# Patient Record
Sex: Male | Born: 1965 | Race: Black or African American | Hispanic: No | Marital: Married | Smoking: Current every day smoker
Health system: Southern US, Community
[De-identification: ages and names within clinical notes are randomized; demographics above are authoritative.]

## PROBLEM LIST (undated history)

## (undated) DIAGNOSIS — Z992 Dependence on renal dialysis: Secondary | ICD-10-CM

## (undated) DIAGNOSIS — N189 Chronic kidney disease, unspecified: Secondary | ICD-10-CM

## (undated) DIAGNOSIS — I10 Essential (primary) hypertension: Secondary | ICD-10-CM

## (undated) HISTORY — PX: APPENDECTOMY: SHX54

## (undated) HISTORY — PX: AV FISTULA PLACEMENT: SHX1204

---

## 2011-09-11 ENCOUNTER — Emergency Department: Payer: Self-pay | Admitting: Emergency Medicine

## 2011-11-19 ENCOUNTER — Inpatient Hospital Stay: Payer: Self-pay | Admitting: Internal Medicine

## 2011-11-19 LAB — BASIC METABOLIC PANEL
Anion Gap: 11 (ref 7–16)
BUN: 21 mg/dL — ABNORMAL HIGH (ref 7–18)
EGFR (African American): 10 — ABNORMAL LOW
EGFR (Non-African Amer.): 8 — ABNORMAL LOW
Osmolality: 279 (ref 275–301)
Potassium: 4.6 mmol/L (ref 3.5–5.1)
Sodium: 139 mmol/L (ref 136–145)

## 2011-11-19 LAB — CBC
HGB: 10.4 g/dL — ABNORMAL LOW (ref 13.0–18.0)
MCV: 89 fL (ref 80–100)
Platelet: 156 10*3/uL (ref 150–440)
RBC: 3.67 10*6/uL — ABNORMAL LOW (ref 4.40–5.90)

## 2011-11-19 IMAGING — CR DG CHEST 2V
1 series · 2 of 2 positions shown · non-contrast
Comparison: none

REASON FOR EXAM: cough
COMMENTS:

[Series 1: pa · 0.17mm/px · 2 of 2 slices shown]
[im 1/2]
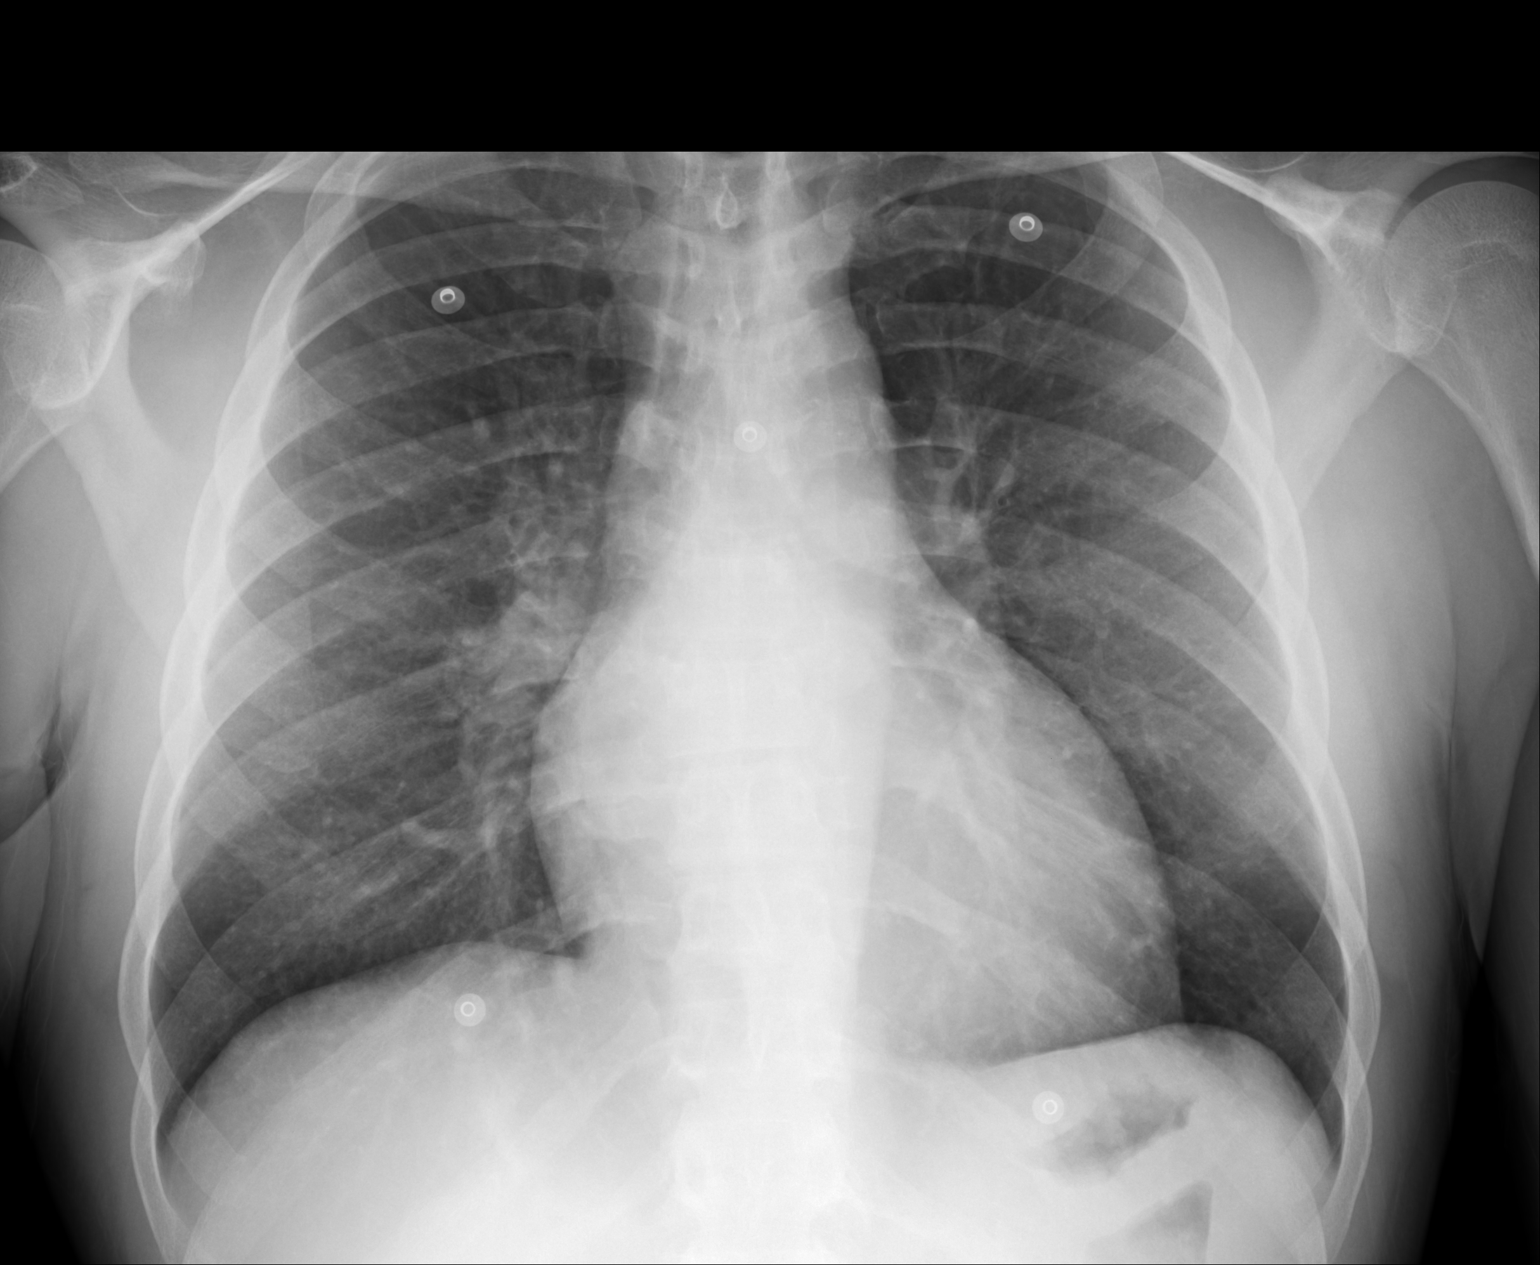
[im 2/2]
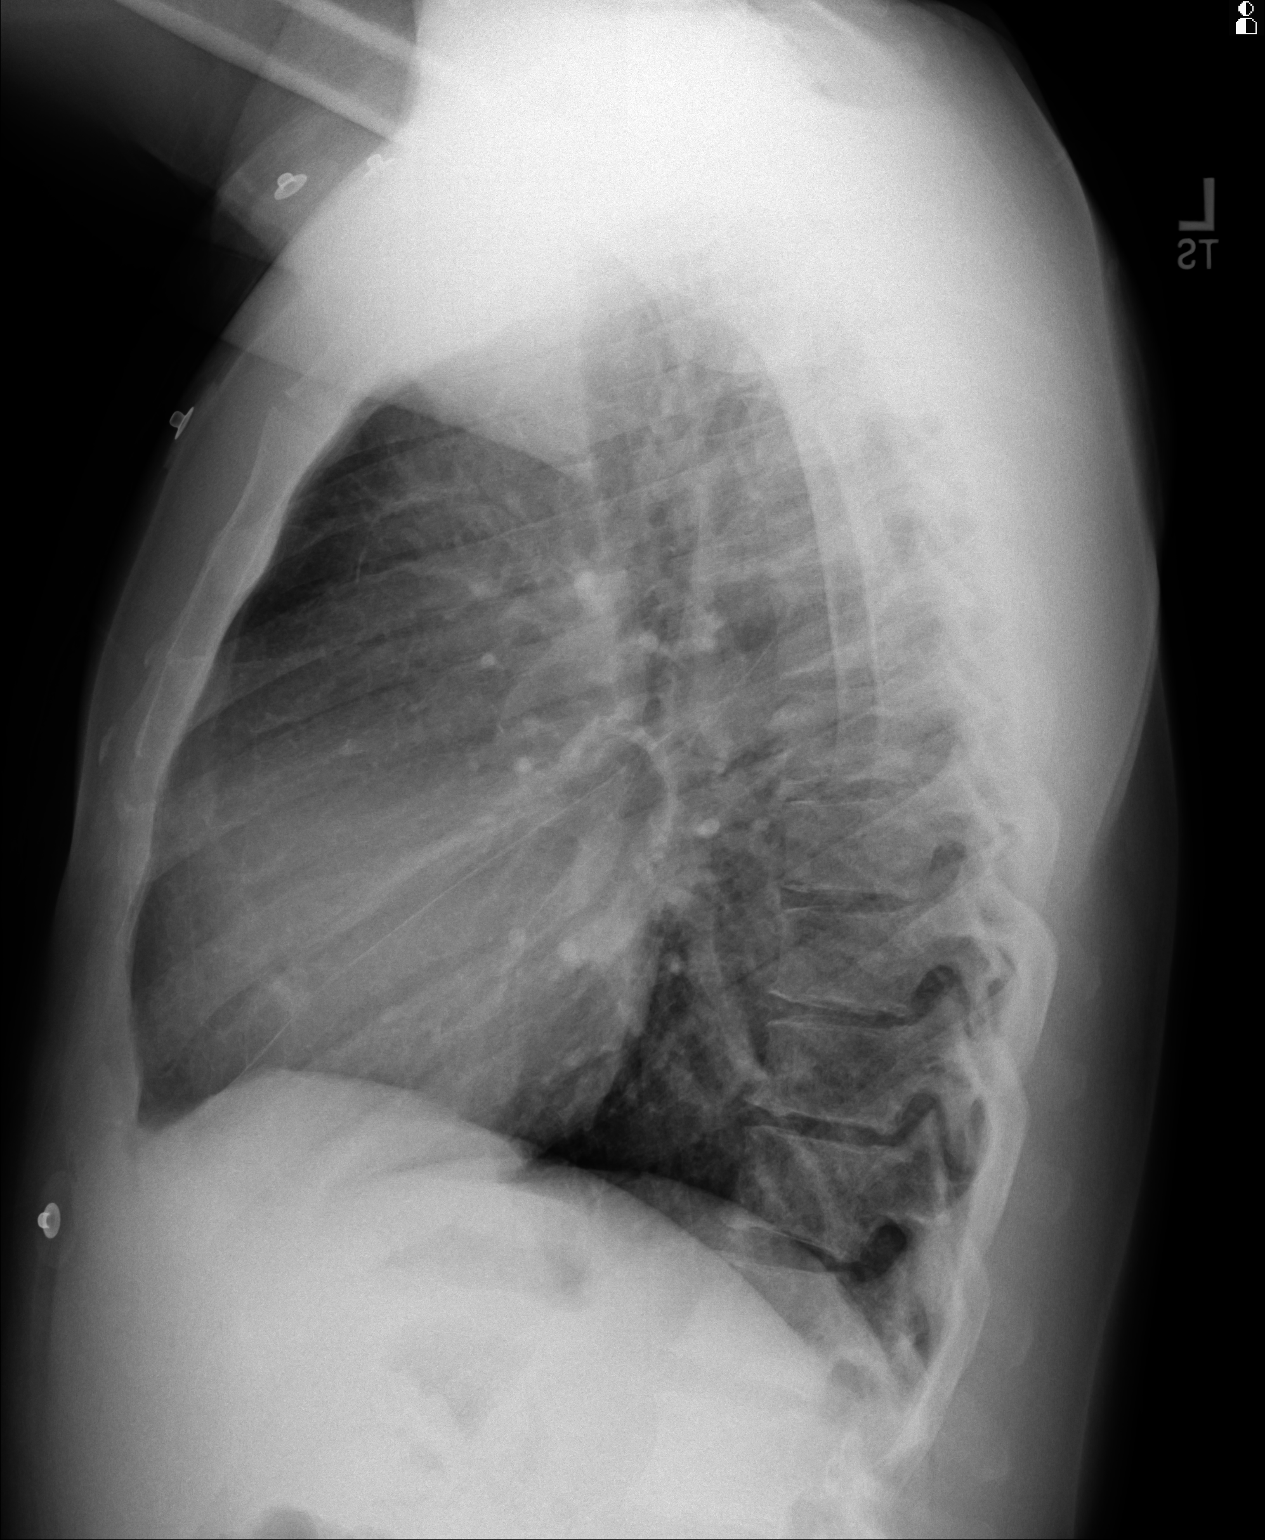

[2 of 2 positions shown; findings below may reference images not displayed]

PROCEDURE:     DXR - DXR CHEST PA (OR AP) AND LATERAL  - [DATE]  [DATE]

RESULT:     The lung fields are clear. No pneumonia, pneumothorax or pleural
effusion is seen. The heart appears mildly enlarged. No pulmonary edema or
pleural effusion is noted. The chest appears mildly hyperinflated
bilaterally which suggests a history of asthma or early manifestation of
COPD. No acute bony abnormalities are seen.
IMPRESSION: 1. No acute changes are identified.
2. The heart appears mildly enlarged.
3. The chest appears mildly hyperinflated bilaterally.

## 2011-11-20 LAB — TROPONIN I: Troponin-I: 0.07 ng/mL — ABNORMAL HIGH

## 2012-07-19 ENCOUNTER — Emergency Department: Payer: Self-pay | Admitting: Unknown Physician Specialty

## 2012-07-19 IMAGING — CR DG HAND COMPLETE 3+V*L*
1 series · 3 of 3 positions shown · non-contrast
Comparison: none

REASON FOR EXAM: fall
COMMENTS:   May transport without cardiac monitor

PROCEDURE:     DXR - DXR HAND LT COMPLETE  W/OBLIQUES  - [DATE]  [DATE]
RESULT:

[Series 1: x hand pa left · 0.14mm/px · 3 of 3 slices shown]
[im 1/3]
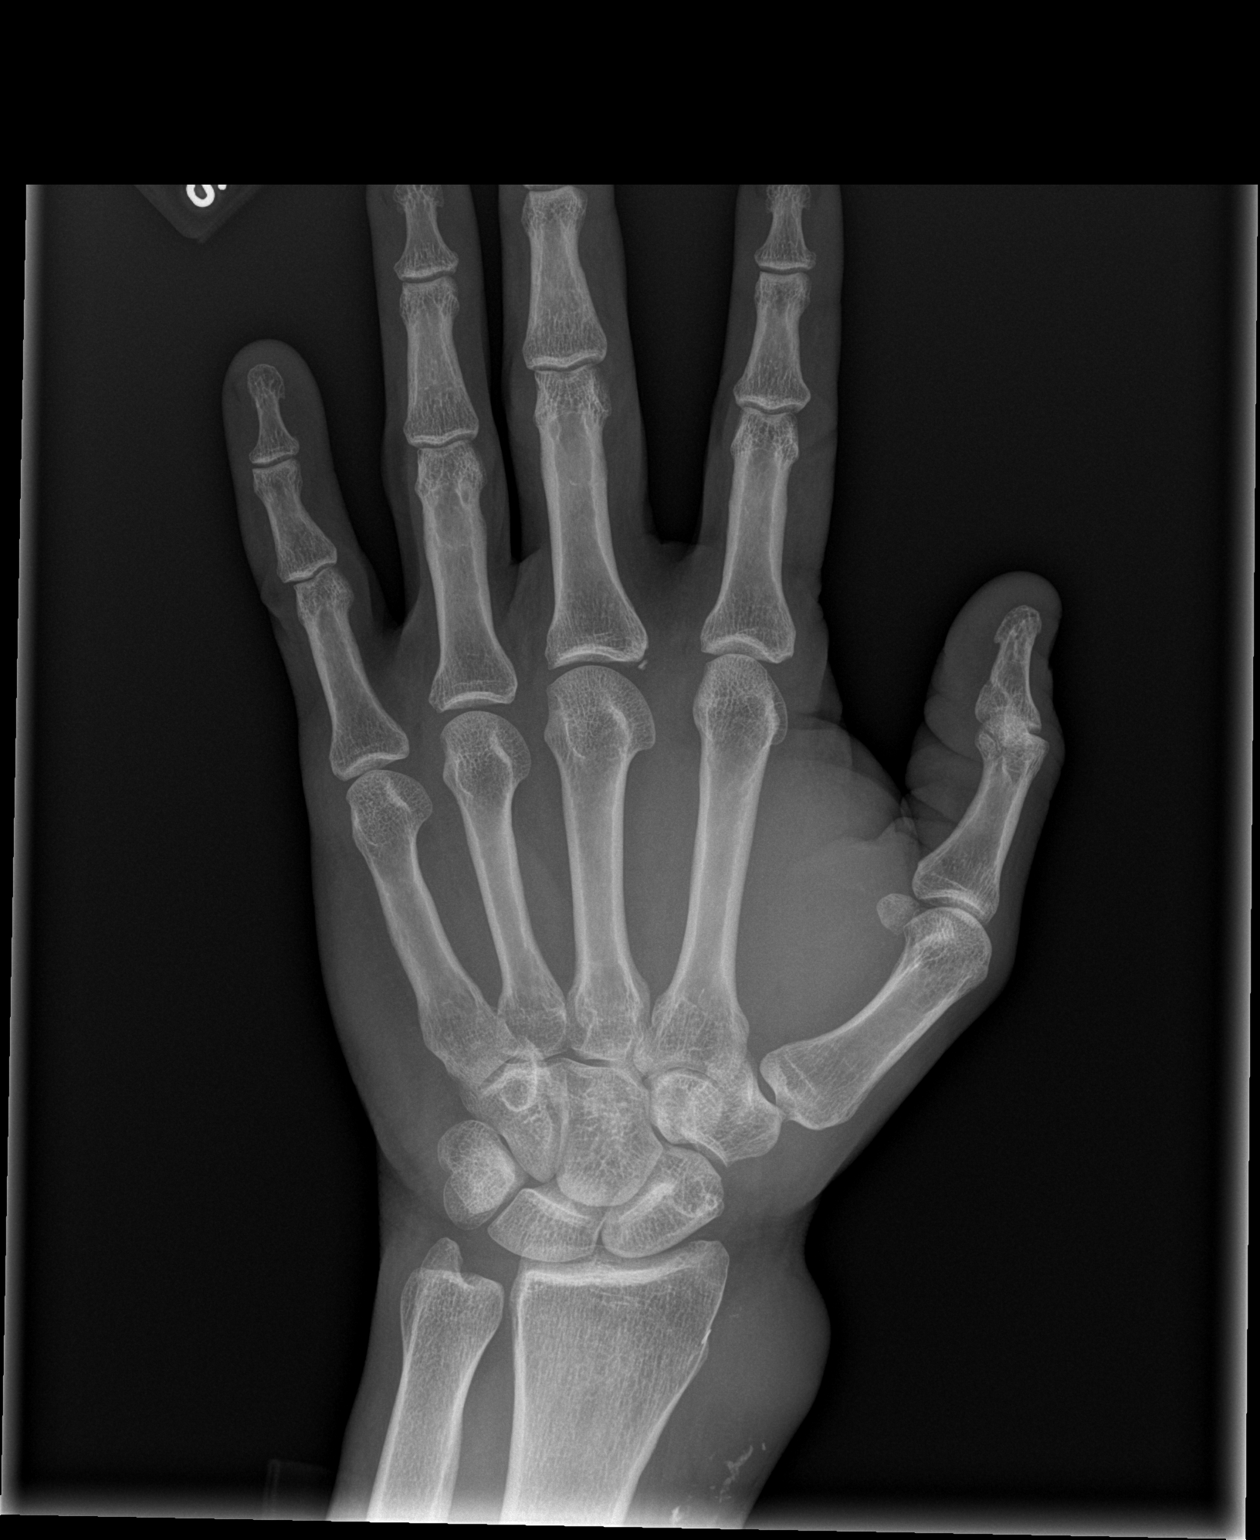
[im 2/3]
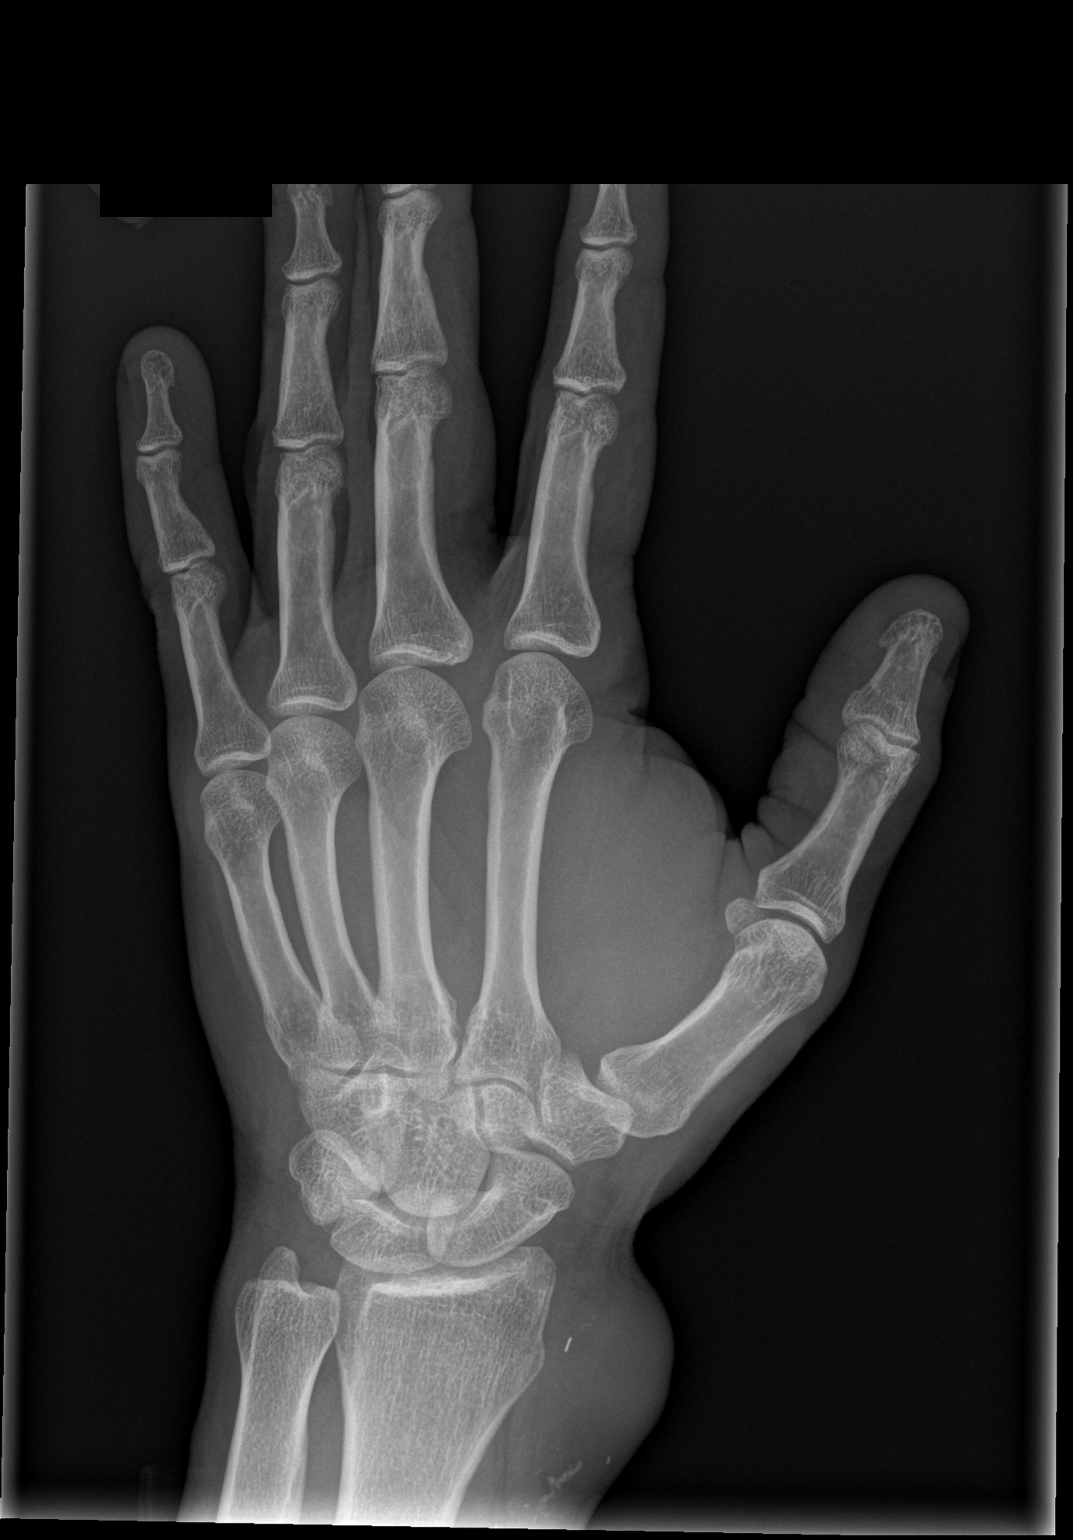
[im 3/3]
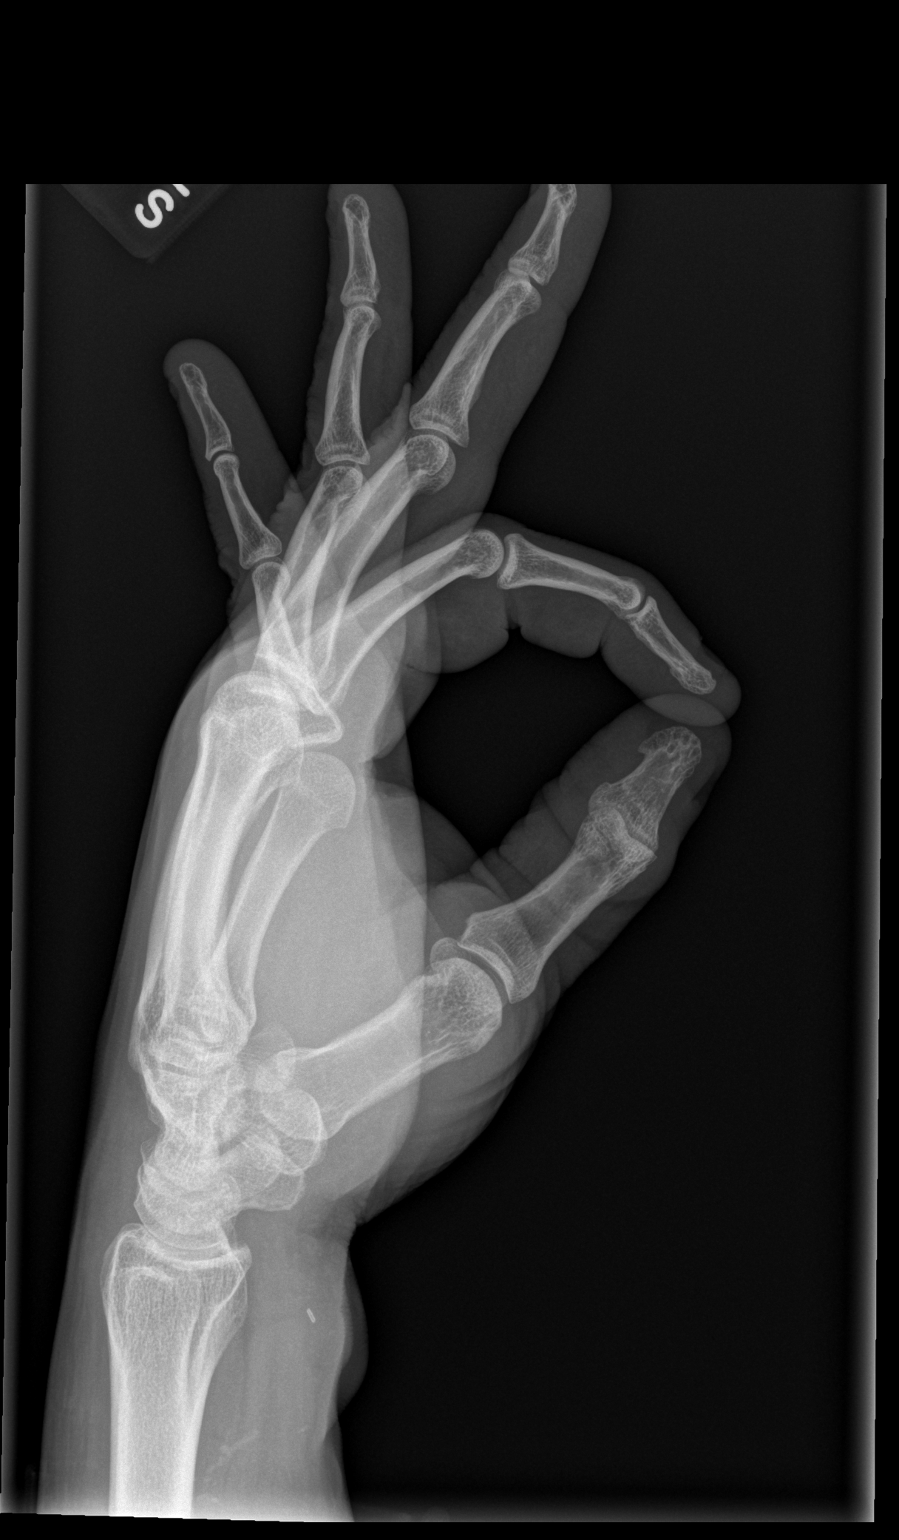

[3 of 3 positions shown; findings below may reference images not displayed]

FINDINGS: Left hand images demonstrate a small bony density along the
lateral aspect of the base of the proximal phalanx of the third digit which
could represent an avulsion. The bony structures otherwise appear intact.
There are soft tissue calcifications with some swelling in what appears to
be a small surgical clip along the lateral aspect of the anterior portion of
the wrist region. Correlate clinically.
IMPRESSION: Please see above.

[REDACTED]

## 2012-07-19 IMAGING — CR DG SHOULDER 3+V*R*
1 series · 4 of 4 positions shown · non-contrast
Comparison: none

REASON FOR EXAM: fall
COMMENTS:   May transport without cardiac monitor

PROCEDURE:     DXR - DXR SHOULDER RIGHT COMPLETE  - [DATE]  [DATE]
RESULT:     Right shoulder images show the humeral head located in the
glenoid. No fracture is evident.

[Series 1: w shoulder internal right · 0.14mm/px · 4 of 4 slices shown]
[im 1/4]
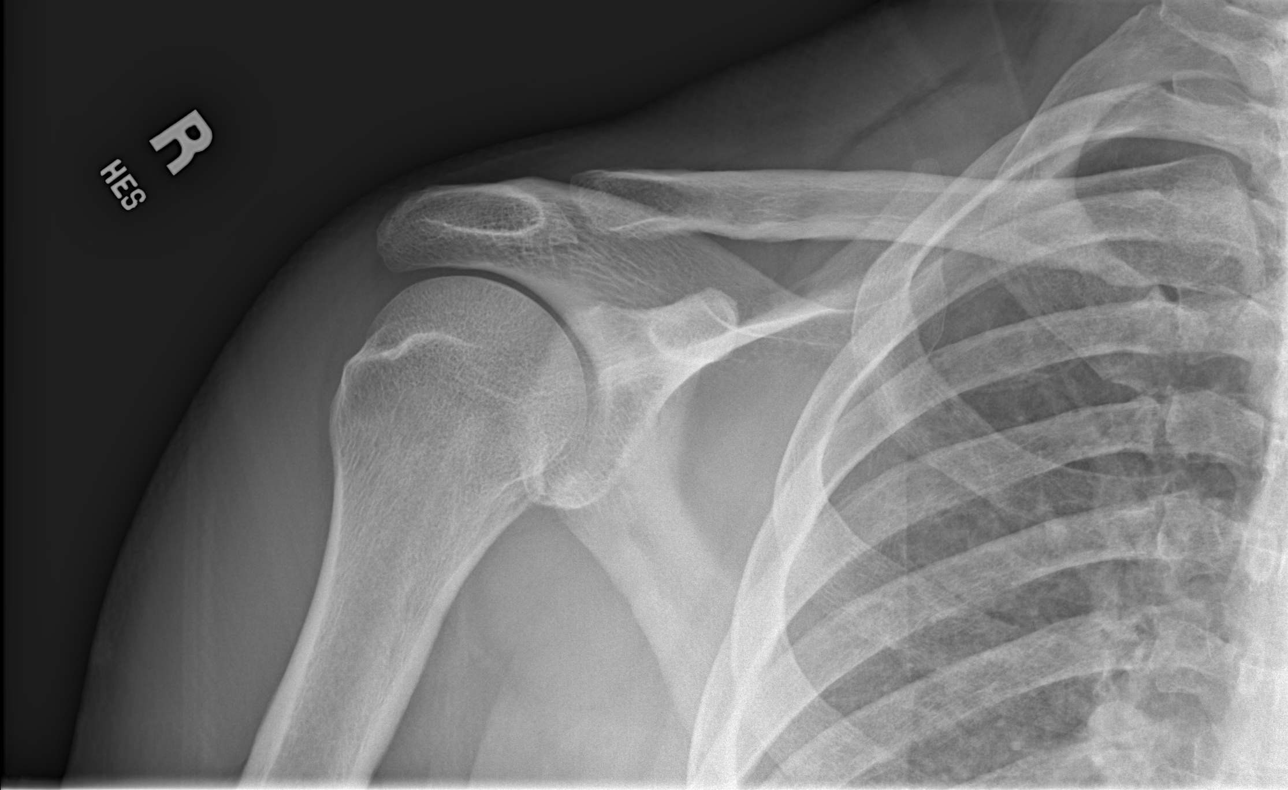
[im 2/4]
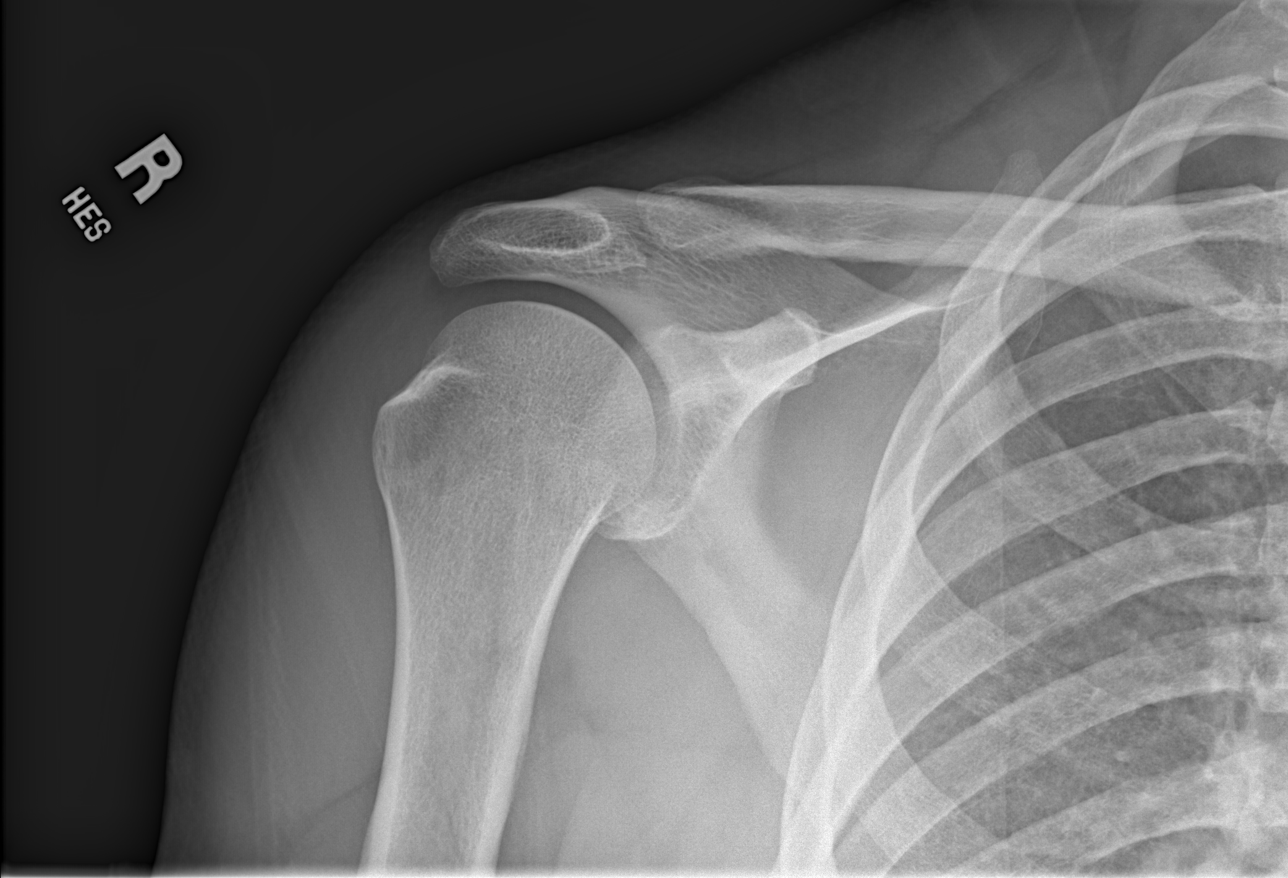
[im 3/4]
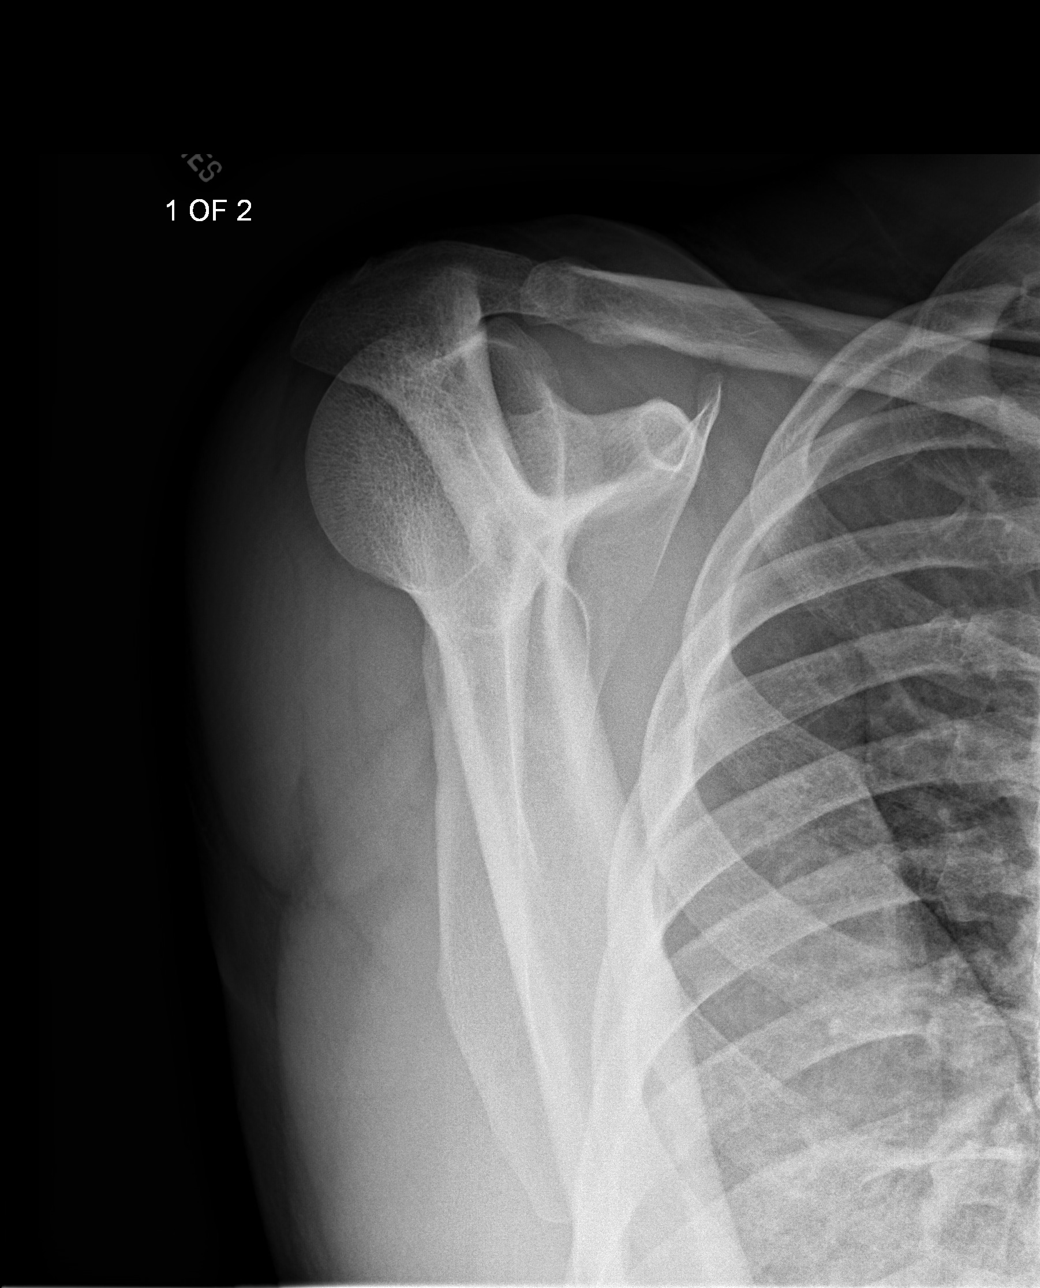
[im 4/4]
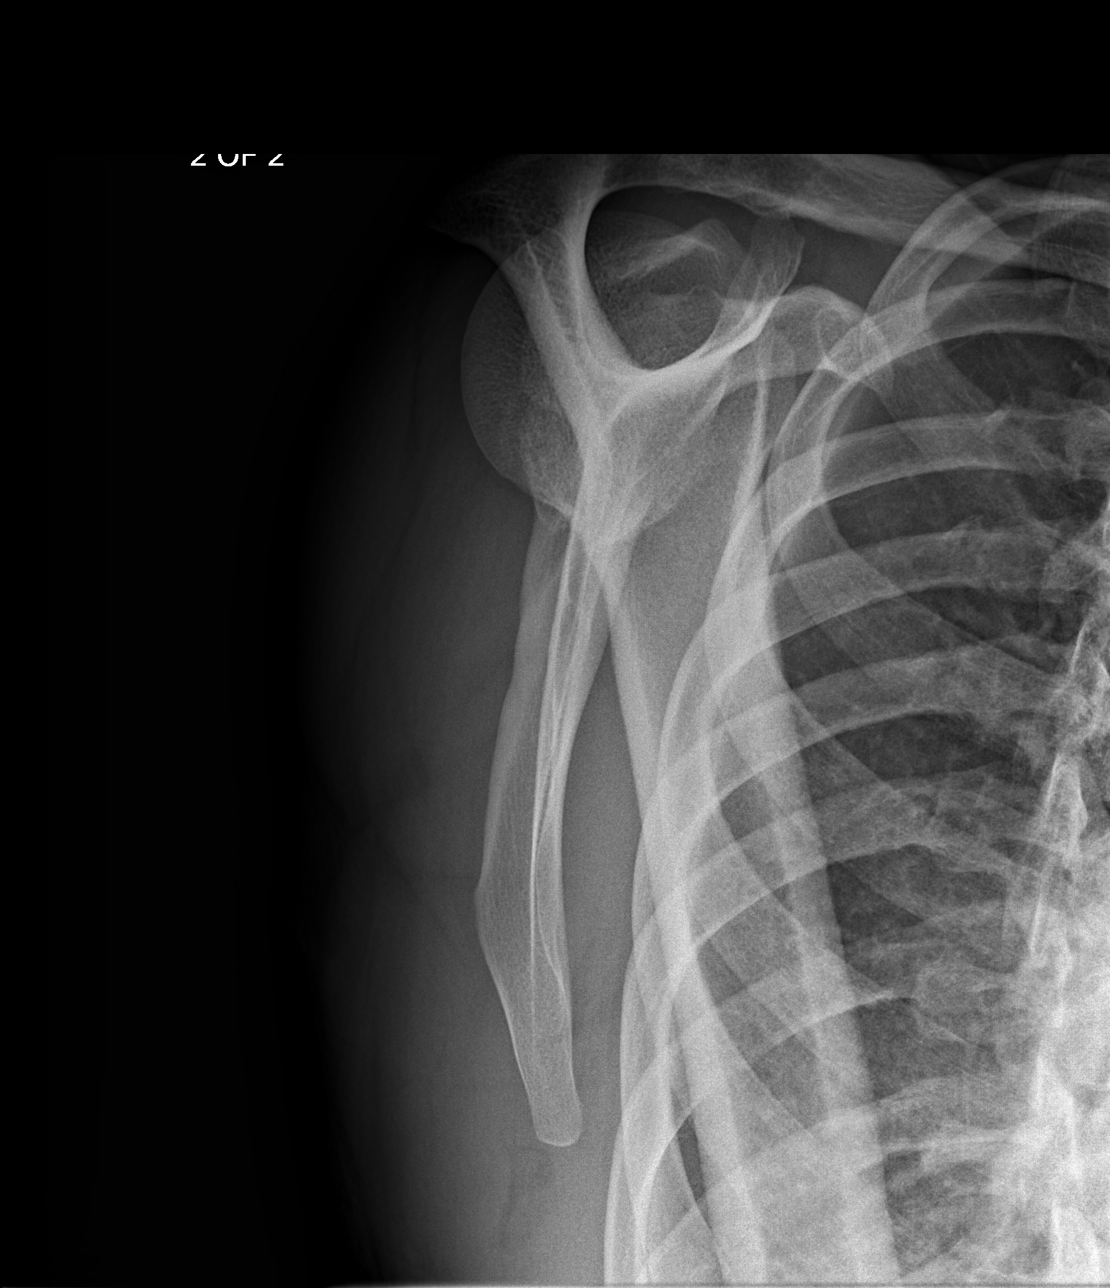

[4 of 4 positions shown; findings below may reference images not displayed]

IMPRESSION: No acute bony abnormality evident.

[REDACTED]

## 2012-07-19 IMAGING — CR RIGHT HAND - COMPLETE 3+ VIEW
1 series · 3 of 3 positions shown · non-contrast
Comparison: none

REASON FOR EXAM: fall
COMMENTS:   May transport without cardiac monitor

PROCEDURE:     DXR - DXR HAND RT COMPLETE W/OBLIQUES  - [DATE]  [DATE]
RESULT:     Right hand images show no evidence of fracture, dislocation or
radiopaque foreign body.

[Series 1: x hand pa right · 0.14mm/px · 3 of 3 slices shown]
[im 1/3]
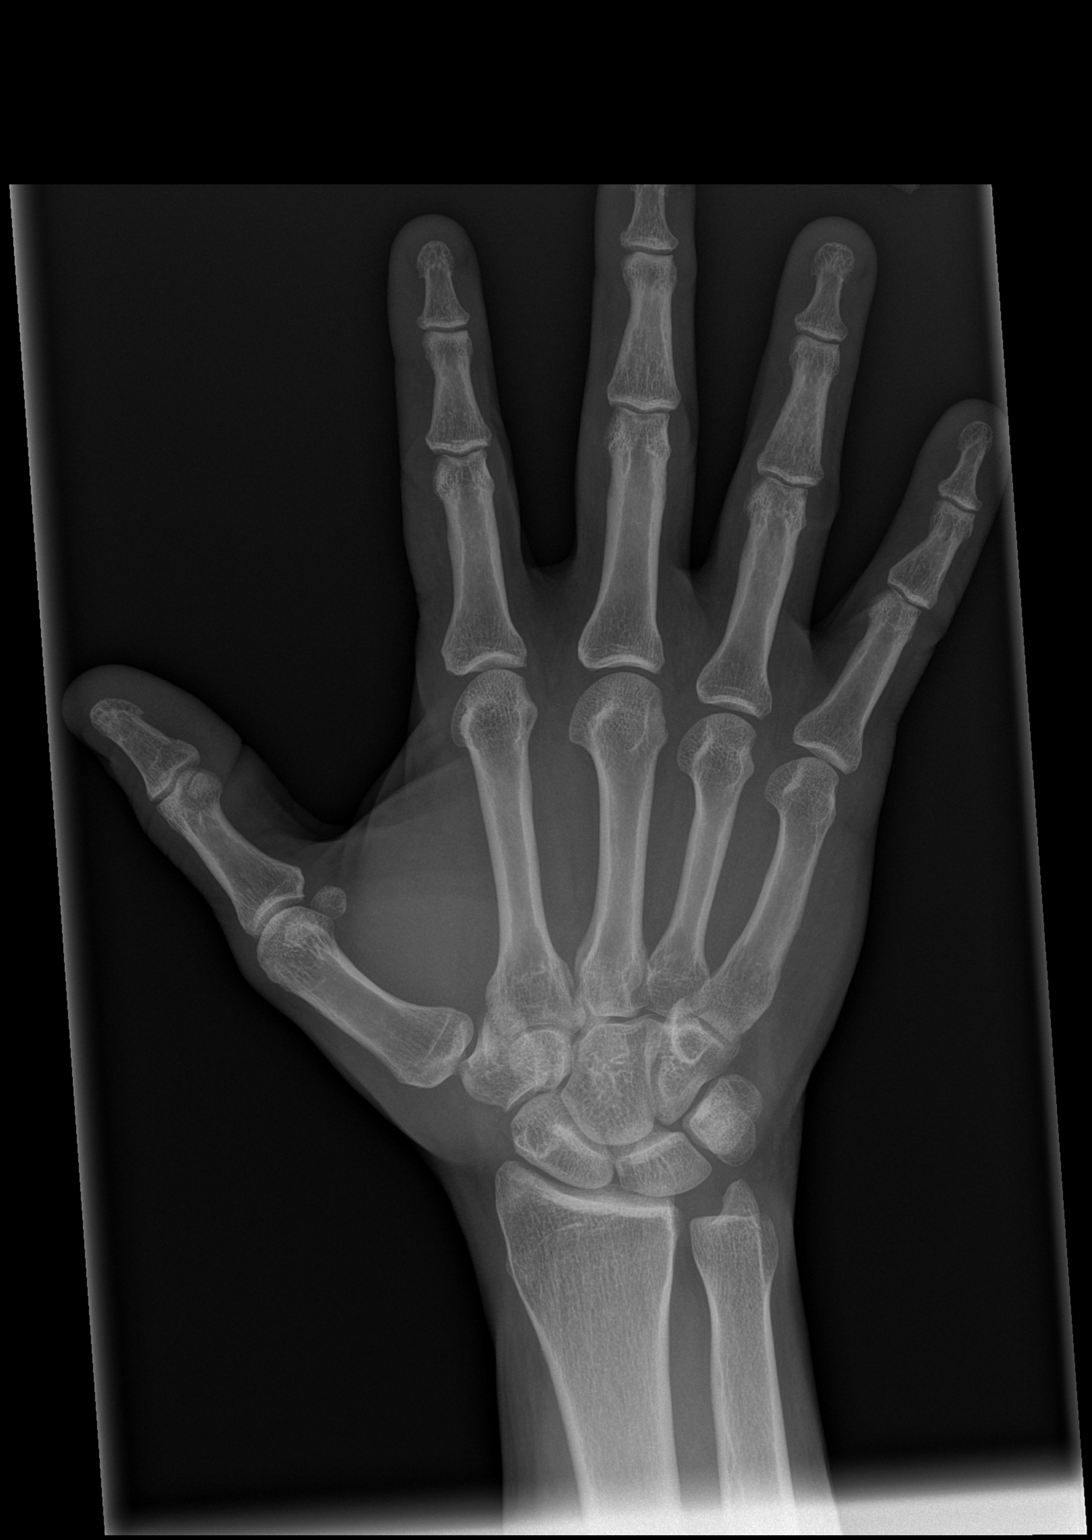
[im 2/3]
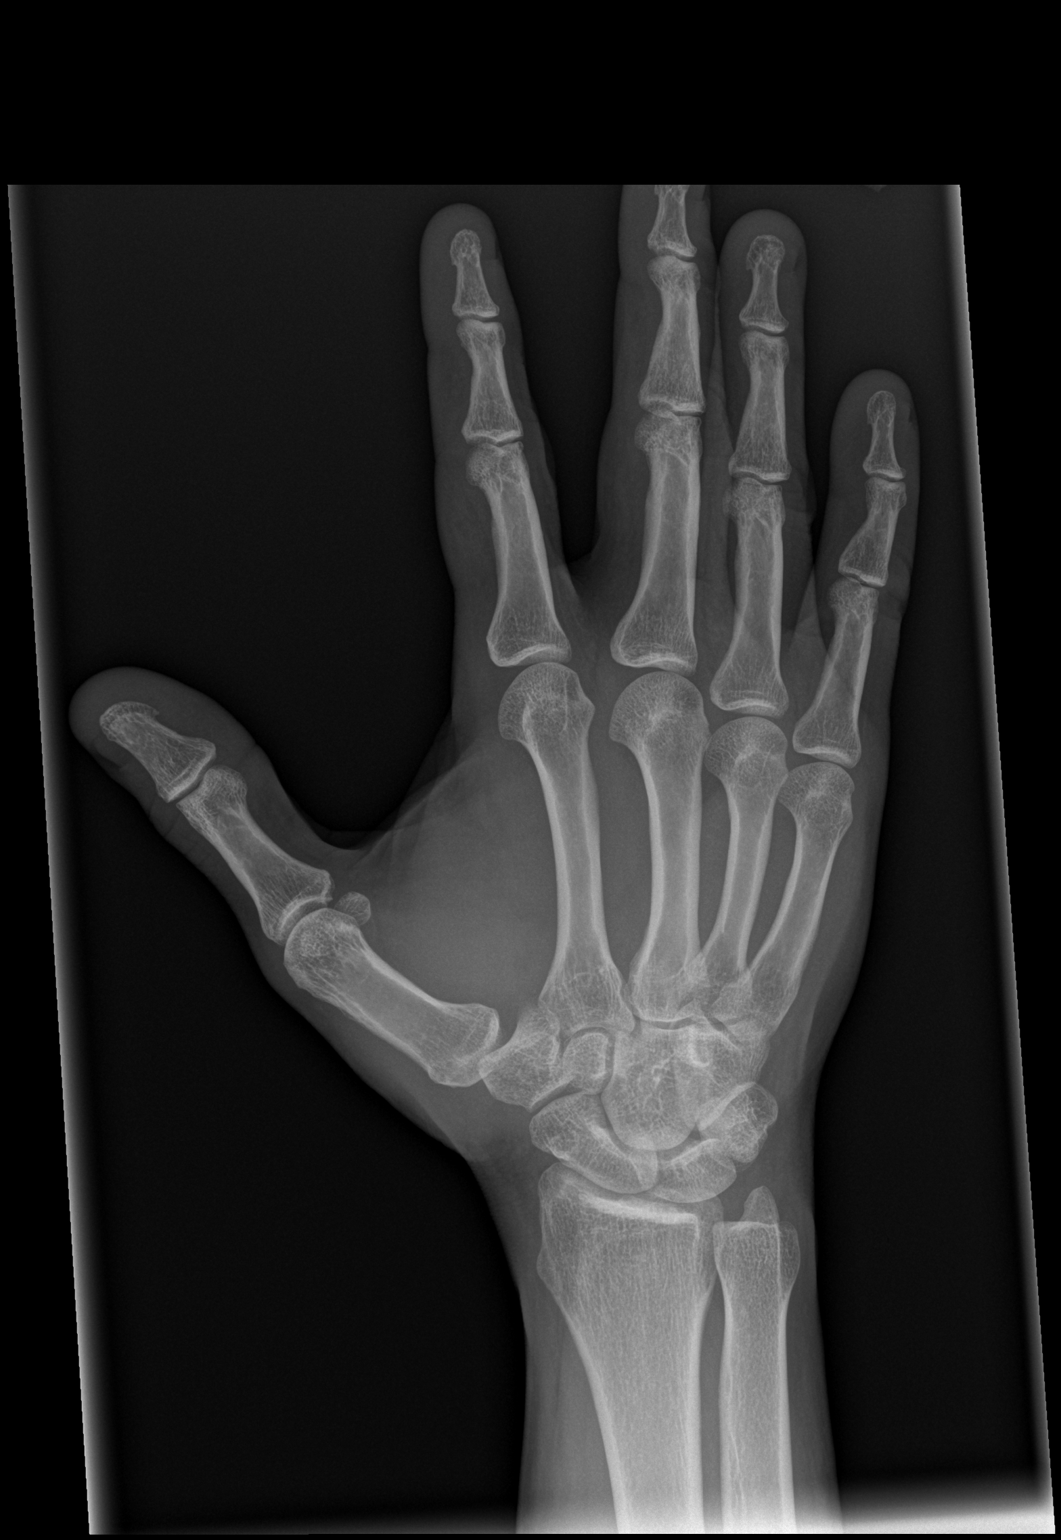
[im 3/3]
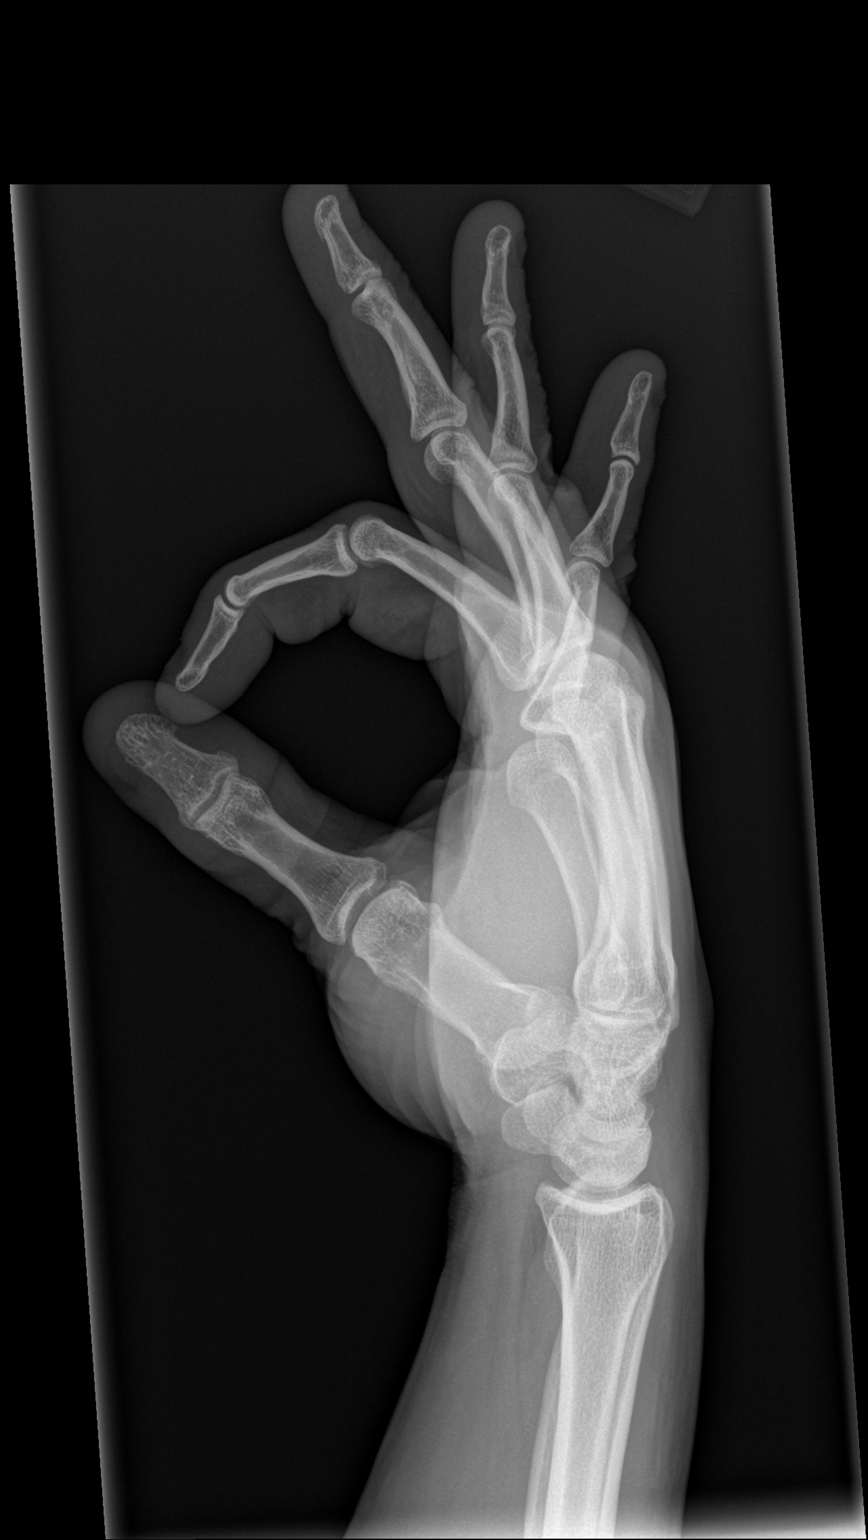

[3 of 3 positions shown; findings below may reference images not displayed]

IMPRESSION: Please see above.

[REDACTED]

## 2014-01-23 ENCOUNTER — Emergency Department: Payer: Self-pay | Admitting: Emergency Medicine
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
## 2014-06-08 DIAGNOSIS — Z720 Tobacco use: Secondary | ICD-10-CM | POA: Insufficient documentation

## 2014-07-04 ENCOUNTER — Emergency Department: Payer: Self-pay | Admitting: Emergency Medicine

## 2014-12-17 NOTE — H&P (Signed)
PATIENT NAME:  AVID, SOLLIE MR#:  [id number] DATE OF BIRTH:  1965/09/11  DATE OF ADMISSION:  11/19/2011  PRIMARY CARE PHYSICIAN: No local doctor.  NEPHROLOGIST: Anthonette Legato, MD  CHIEF COMPLAINT: Elevated blood pressure, chest pain, left arm numbness, and pain.   HISTORY OF PRESENT ILLNESS: Mr. Monett is a 49 year old African American male with history of end-stage renal disease, on hemodialysis. He just had his dialysis this morning. He presented to the emergency room with multiple complaints, one of them that he has congestion and cough for about one month. Additionally, he is seeing that his blood pressure is uncontrolled. He has mild dizziness and midsternal chest pain radiating to the left arm. The severity of the pain is an 8 on a scale of 10 described as aching pain and dull. There is mild shortness of breath. No vomiting. The patient denies having any fever; no chills. His systolic blood pressure is more than 230, and he received several medications in the emergency department without significant improvement. The patient was admitted for further evaluation and treatment and to control his hypertension and to evaluate his chest pain.   REVIEW OF SYSTEMS: CONSTITUTIONAL: Denies any fever. No chills. No night sweats. No fatigue. EYES: No blurring of vision. No double vision. ENT: No hearing impairment. No sore throat. No dysphagia. CARDIOVASCULAR: Admits to having midsternal chest pain and mild shortness of breath. No edema. No syncope. RESPIRATORY: Reports mild dry cough for the last one month and nasal congestion. Admits having chest pain as above. GASTROINTESTINAL: No abdominal pain, no vomiting, and no diarrhea. GENITOURINARY: No dysuria or frequency of urination. MUSCULOSKELETAL: No joint pain or swelling. No muscular pain or swelling. INTEGUMENTARY: No skin rash. No ulcers. NEUROLOGIC: No focal weakness. No seizure activity. For the headache, he says every now and then. PSYCHIATRY: No anxiety  or depression. I found the patient is less engaging and difficult to extract information from him. ENDOCRINE: No heat or cold intolerance. No polyuria or polydipsia.   PAST MEDICAL HISTORY:  1. History of severe systemic hypertension. 2. History of end-stage renal disease on hemodialysis Monday, Wednesday, and Friday.   PAST SURGICAL HISTORY: Appendectomy.   FAMILY HISTORY: Negative for hypertension. Negative for kidney failure. His father died from complications of congestive heart failure.   SOCIAL HABITS: He smokes cigarettes occasionally, does not specify how much. No alcoholism. No other drug abuse.   SOCIAL HISTORY: The patient is unemployed and living on disability.   ADMISSION MEDICATIONS:  1. Clonidine 0.1 mg three times daily. 2. Hydralazine 100 mg three times daily. 3. Labetalol 200 mg stating he takes 3 pills twice a day. 4. Losartan once a day. The dose was not specified. 5. Amlodipine 10 mg once a day.   ALLERGIES: No known drug allergies.   PHYSICAL EXAMINATION:   VITAL SIGNS: Blood pressure 235/120, respiratory rate 18, pulse 92, temperature 99.1, and oxygen saturation 99%.   GENERAL APPEARANCE: Young male lying in bed in no acute distress.   HEAD AND NECK EXAMINATION: No pallor. No icterus. No cyanosis.   EARS, NOSE, AND THROAT: Hearing was normal. Nasal mucosa, lips, and tongue were normal.   EYES: Normal eyelids and conjunctiva. Pupils about 5 mm, equal and reactive to light.   NECK: Supple. Trachea at midline. No thyromegaly. No cervical lymphadenopathy.   HEART: Normal S1 and S2. No S3 or S4. No murmur. No gallop. No carotid bruits.   LUNGS: Normal breathing pattern without use of accessory muscles. No rales. No wheezing.  ABDOMEN: Soft without tenderness. No hepatosplenomegaly. No masses. No hernias.   MUSCULOSKELETAL: No joint swelling. No clubbing.   SKIN: No ulcers. No subcutaneous nodules. He has an AV fistula located on the left forearm with  good palpable thrill.   NEURO: Cranial nerves II through XII were intact. No focal motor deficits.   PSYCHIATRY: The patient is alert and oriented x3. Mood and affect were flat.   LABS/STUDIES: Chest x-ray showed heart size at upper normal. No pulmonary congestion. No effusion. No consolidation.   EKG showed normal sinus rhythm at a rate of 83 per minute. T wave inversion in leads I and aVL with nonspecific ST-T wave abnormalities in the lateral leads. This could be from LVH. I do not have an old EKG to compare.   Serum glucose 79, BUN 21, creatinine 7.7, sodium 139, and potassium 4.6. CBC showed white count 6000, hemoglobin 10, hematocrit 32, and platelet count 156.   ASSESSMENT:  1. Malignant hypertension. 2. Chest pain.  3. Symptoms of upper respiratory tract infection versus allergic rhinitis.  4. End-stage renal disease, on hemodialysis.  5. Anemia of chronic disease.  PLAN: The patient will be admitted to the telemetry unit. Follow-up on cardiac enzymes. Repeat EKG in the morning. Nephrology consultation. In an attempt to control blood pressure, we will continue medications as listed above. He will receive additional hydralazine 10 mg intravenously p.r.n. Nephrology consultation with Dr. Holley Raring. His blood pressure needs to be controlled before       considering doing stress test for him. We will obtain his records from [facility]. They have already been called, but they are right now busy, but they promised to fax the information later.   TIME SPENT EVALUATING THIS PATIENT: More than 50 minutes.  ____________________________ Clovis Pu. Lenore Manner, MD amd:slb D: 11/19/2011 23:50:13 ET T: 11/20/2011 07:56:57 ET JOB#: [id number]  cc: Clovis Pu. Lenore Manner, MD, <Dictator> Munsoor Lilian Kapur, MD Mike Craze Irven Coe MD ELECTRONICALLY SIGNED 11/25/2011 23:37

## 2014-12-17 NOTE — Discharge Summary (Signed)
PATIENT NAME:  William Hobbs, William Hobbs MR#:  [id number] DATE OF BIRTH:  03/04/1966  DATE OF ADMISSION:  11/19/2011 DATE OF DISCHARGE:  11/20/2011  DISCHARGE DIAGNOSES:  1. Malignant hypertension.  2. Sinusitis.  3. Chest pain.  4. End-stage renal disease on hemodialysis.  5. Anemia of chronic disease.   DISCHARGE MEDICATIONS:  1. Augmentin 875 mg b.i.d.  2. Saline nasal spray p.r.n.  3. Clonidine 0.1 mg t.i.d.  4. Hydralazine 100 mg t.i.d.  5. Labetalol 200 mg 3 tabs b.i.d.  6. Losartan 100 mg daily.  7. Amlodipine 10 mg daily.  8. Renvela 2400 mg t.i.d.   REASON FOR ADMISSION: This is a 49 year old male with end-stage renal disease. He presents to the Emergency Room with complaints of congestion and cough for several days. His blood pressure has been uncontrolled. He was given a breathing treatment in the ER and started complaining of some mid sternal chest pain. His blood pressure was greater than 230. He was admitted for further diagnosis.   HOSPITAL COURSE:  1. Malignant hypertension. He has hard to control blood pressure as an outpatient. He tells me that spikes like this are not unusual for him. He was given some IV medications and started back on his usual home medications. Blood pressure is much better now with systolics in the [id number].  2. Chest pain. He says this did not happen until he started having the albuterol breathing treatment. He has had no pain since then. He had first troponin at 0.07. A second troponin is less than 0.02. He has had no further symptoms. He does not have any EKG changes. At this point see no reason to have any further inpatient work-up. He can be referred for stress testing as an outpatient.  3. End-stage renal disease. He gets hemodialysis on Tuesday, Thursday, Saturday. He is due tomorrow. Electrolytes look good so he can just get dialysis an outpatient tomorrow.  4. Sinusitis. Gave him a prescription for Augmentin to go home with and use nasal saline spray as  needed.   DISPOSITION: Patient is discharged in stable condition.   FOLLOW UP: He will follow up with Dr. Holley Raring in 1 to 2 weeks.   TIME SPENT ON DISCHARGE: 35 minutes.  ____________________________ Baxter Hire, MD jdj:cms D: 11/20/2011 14:28:24 ET T: 11/22/2011 14:38:50 ET JOB#: [id number]  cc: Baxter Hire, MD, <Dictator> Munsoor Lilian Kapur, MD Baxter Hire MD ELECTRONICALLY SIGNED 11/23/2011 9:42

## 2014-12-20 ENCOUNTER — Emergency Department
Admit: 2014-12-20 | Disposition: A | Discharge status: Home / Self Care | Payer: Self-pay | Admitting: Emergency Medicine

## 2014-12-20 LAB — COMPREHENSIVE METABOLIC PANEL
ALBUMIN: 3.7 g/dL
ANION GAP: 12 (ref 7–16)
Alkaline Phosphatase: 66 U/L
BUN: 19 mg/dL
Bilirubin,Total: 0.3 mg/dL
CREATININE: 7.3 mg/dL — AB
Calcium, Total: 8.9 mg/dL
Chloride: 98 mmol/L — ABNORMAL LOW
Co2: 32 mmol/L
EGFR (Non-African Amer.): 8 — ABNORMAL LOW
GFR CALC AF AMER: 9 — AB
GLUCOSE: 76 mg/dL
Potassium: 3.5 mmol/L
SGOT(AST): 14 U/L — ABNORMAL LOW
SGPT (ALT): 12 U/L — ABNORMAL LOW
Sodium: 142 mmol/L
TOTAL PROTEIN: 7.1 g/dL

## 2014-12-20 LAB — CBC
HCT: 31.7 % — ABNORMAL LOW (ref 40.0–52.0)
HGB: 10 g/dL — ABNORMAL LOW (ref 13.0–18.0)
MCH: 28.3 pg (ref 26.0–34.0)
MCHC: 31.6 g/dL — ABNORMAL LOW (ref 32.0–36.0)
MCV: 90 fL (ref 80–100)
PLATELETS: 184 10*3/uL (ref 150–440)
RBC: 3.54 10*6/uL — ABNORMAL LOW (ref 4.40–5.90)
RDW: 16.2 % — ABNORMAL HIGH (ref 11.5–14.5)
WBC: 6.5 10*3/uL (ref 3.8–10.6)

## 2014-12-20 LAB — TROPONIN I: TROPONIN-I: 0.05 ng/mL — AB

## 2014-12-20 IMAGING — CT CT HEAD WITHOUT CONTRAST
1 series · 1 of 1 positions shown · non-contrast
Comparison: None.

CLINICAL DATA: Hypertension.

EXAM:
CT HEAD WITHOUT CONTRAST
TECHNIQUE: Contiguous axial images were obtained from the base of the skull
through the vertex without intravenous contrast.

[Series 1: topogram 1.0 t20s · sagittal · 1.0mm · 1.00mm/px · 1 of 1 slices shown]
[im 1/1]
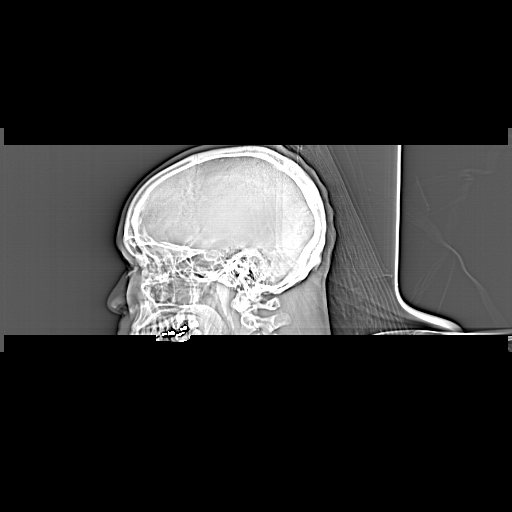

[1 of 1 positions shown; findings below may reference images not displayed]

FINDINGS: No mass lesion. No midline shift. No acute hemorrhage or hematoma.
No extra-axial fluid collections. No evidence of acute infarction.
There is subcortical white matter lucency high in the frontal lobes
which most likely represents small vessel ischemic disease. Brain
parenchyma is otherwise normal. No osseous abnormality.
IMPRESSION: White matter lucencies high in both frontal lobes, probably
representing small vessel disease.

## 2014-12-20 IMAGING — CT CT HEAD WITHOUT CONTRAST
1 series · 16 of 30 positions shown, 20 images · non-contrast
Comparison: None.

CLINICAL DATA: Hypertension.

EXAM:
CT HEAD WITHOUT CONTRAST
TECHNIQUE: Contiguous axial images were obtained from the base of the skull
through the vertex without intravenous contrast.

[Series 2: head wo · axial · 0.46mm/px · z∈[-202,-54]mm · 16 of 36 slices shown, 20 images]
[im 2/36  brain]
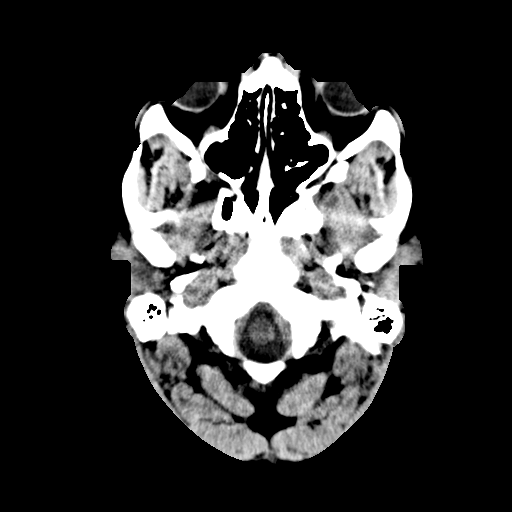
[im 2/36  bone]
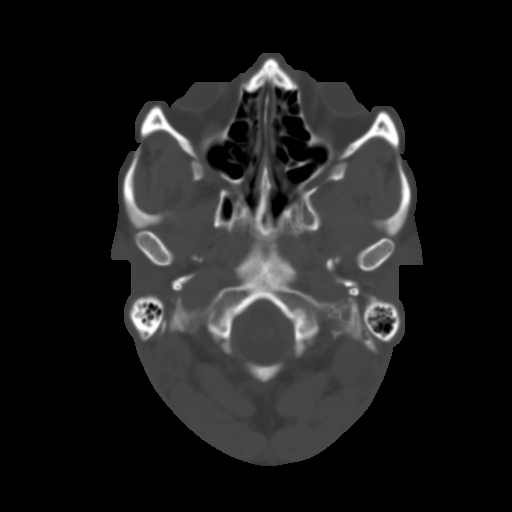
[im 4/36  brain]
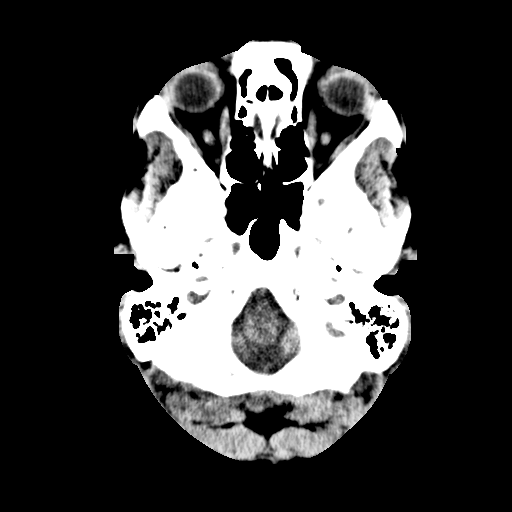
[im 7/36  brain]
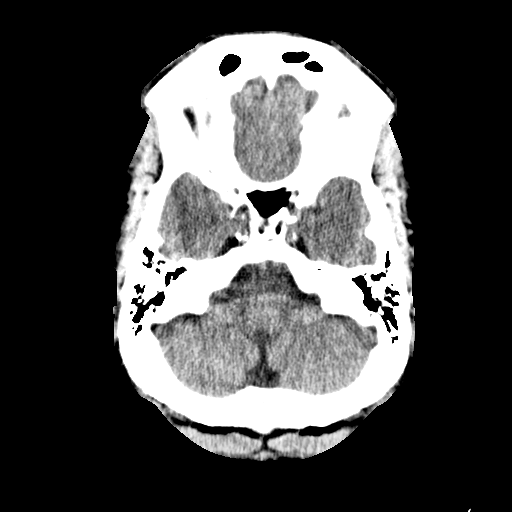
[im 9/36  brain]
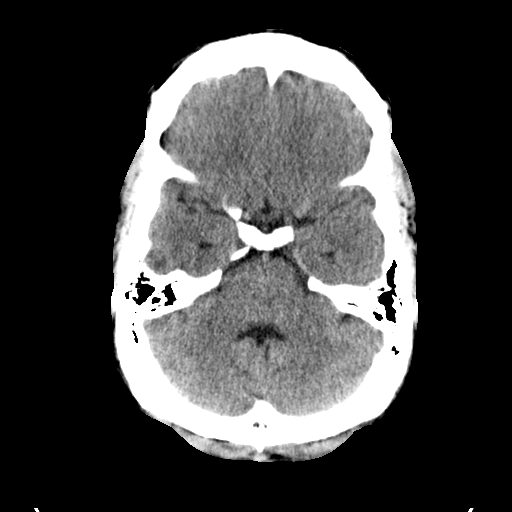
[im 10/36  brain]
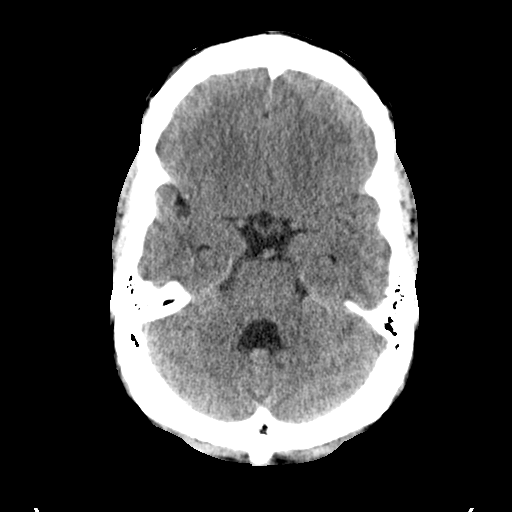
[im 10/36  bone]
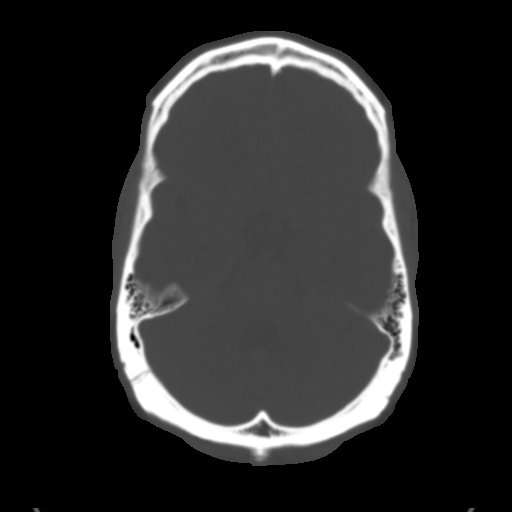
[im 13/36  brain]
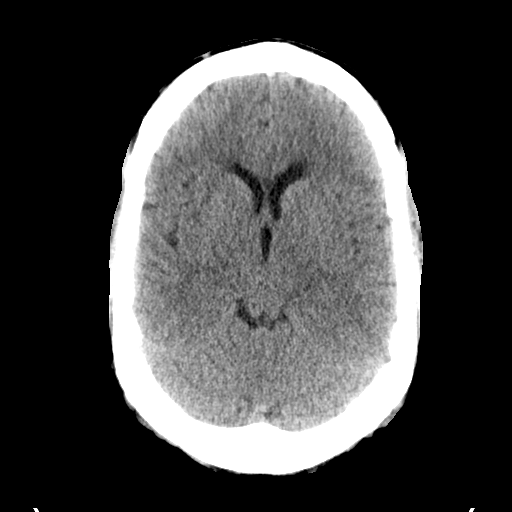
[im 15/36  brain]
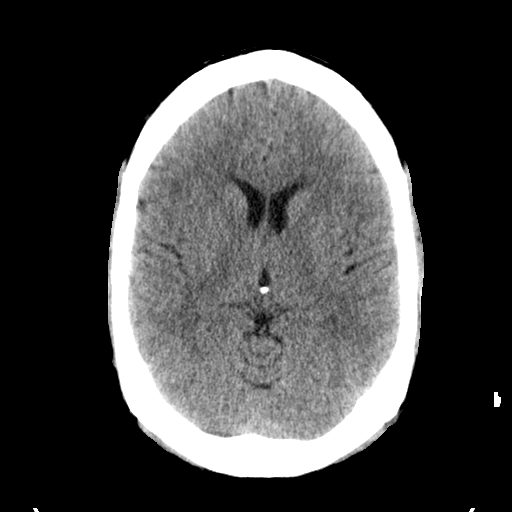
[im 17/36  brain]
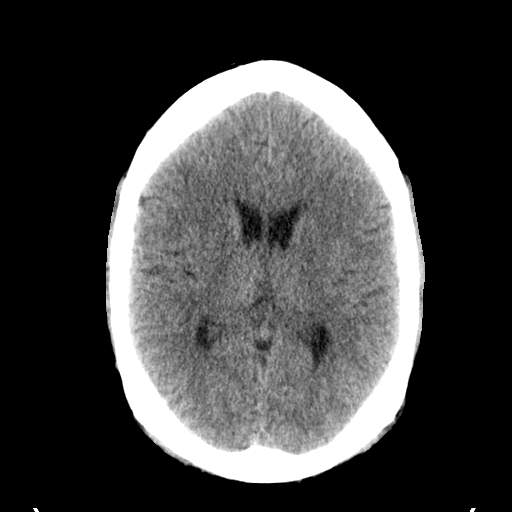
[im 19/36  brain]
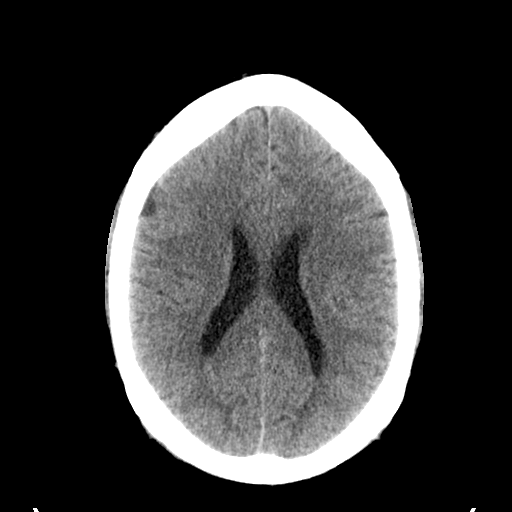
[im 19/36  bone]
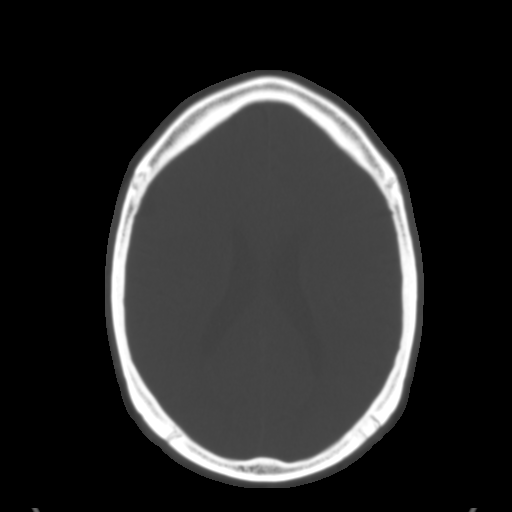
[im 21/36  brain]
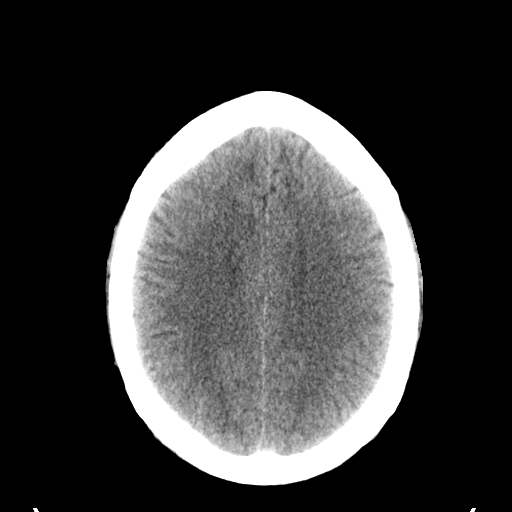
[im 23/36  brain]
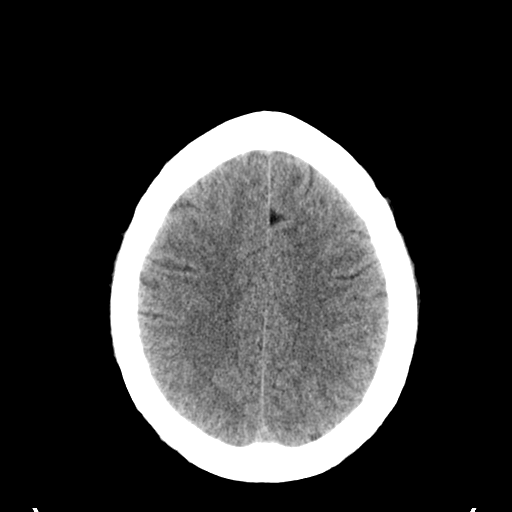
[im 26/36  brain]
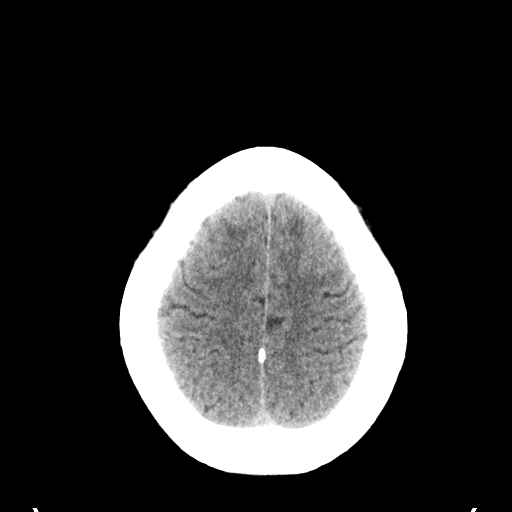
[im 27/36  brain]
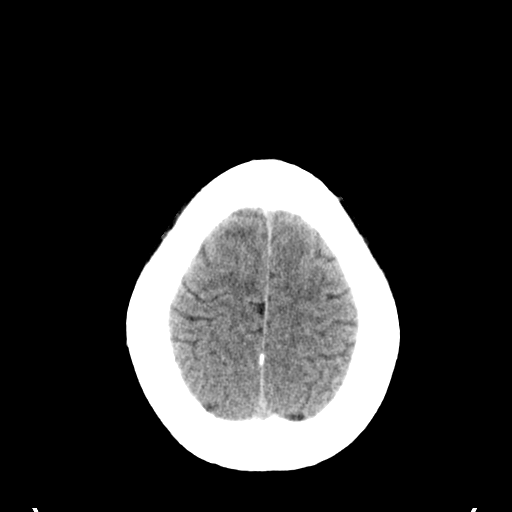
[im 27/36  bone]
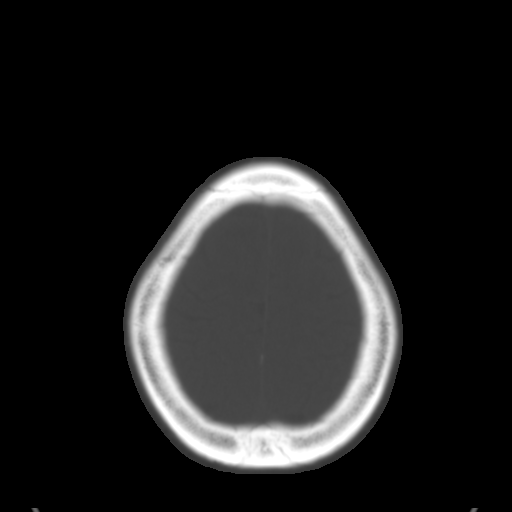
[im 29/36  brain]
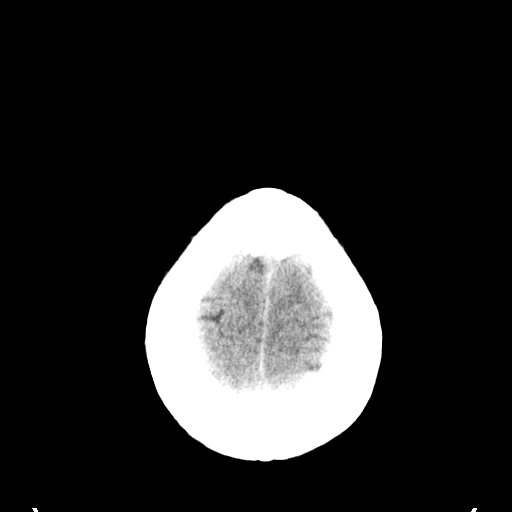
[im 32/36  brain]
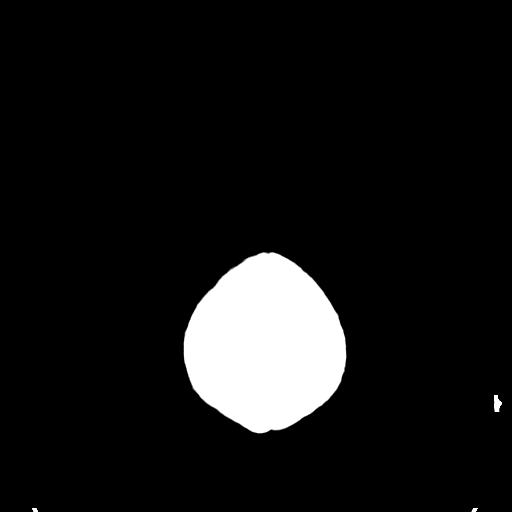
[im 34/36  brain]
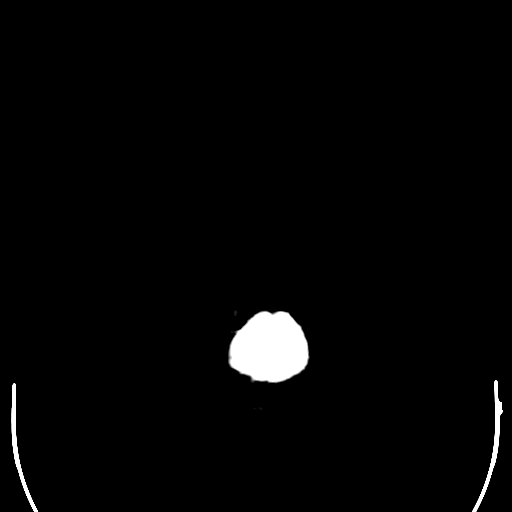

[16 of 30 positions shown; findings below may reference images not displayed]

FINDINGS: No mass lesion. No midline shift. No acute hemorrhage or hematoma.
No extra-axial fluid collections. No evidence of acute infarction.
There is subcortical white matter lucency high in the frontal lobes
which most likely represents small vessel ischemic disease. Brain
parenchyma is otherwise normal. No osseous abnormality.
IMPRESSION: White matter lucencies high in both frontal lobes, probably
representing small vessel disease.

## 2014-12-21 LAB — TROPONIN I: TROPONIN-I: 0.07 ng/mL — AB

## 2014-12-22 LAB — PHOSPHORUS: PHOSPHORUS: 3.8 mg/dL

## 2014-12-23 ENCOUNTER — Observation Stay
Admit: 2014-12-23 | Discharge: 2014-12-23 | Disposition: A | Discharge status: Home / Self Care | Payer: Medicare Other | Source: Ambulatory Visit | Attending: Internal Medicine | Admitting: Internal Medicine

## 2014-12-24 NOTE — H&P (Addendum)
PATIENT NAME:  William Hobbs, William Hobbs MR#:  [id number] DATE OF BIRTH:  [birth year]  DATE OF ADMISSION:  12/20/2014  PRIMARY PHYSICIAN: Mamie Levers, MD   EMERGENCY ROOM PHYSICIAN: Verdia Kuba. Paduchowski, MD  CHIEF COMPLAINT: Elevated blood pressure.   HISTORY OF PRESENT ILLNESS: This is a 49 year old African American male with history of ESRD and hemodialysis Monday, Wednesday, Friday, was sent in from dialysis because his blood pressure was high at 200/109. The patient has been having issues with high blood pressure and Dr. Juleen China has added hydralazine today, but because his blood pressure was very high at dialysis he was sent in from dialysis by dialysis nurse. The patient received 1 dose of hydralazine 10 mg IV and was sent home from ER. The patient was brought back again because Dr. Juleen China called ER and said to monitor him overnight and adjust his blood pressure medications. Reportedly, the patient is having issues with blood pressure for a long time and it has always been high at dialysis at 200/100s. The patient does not check at home and he does not know home it will be home. He has some headache today, but denies any blurred vision. No nausea. No vomiting. No chest pain. No shortness of breath. Finished his full dialysis today.   PAST MEDICAL HISTORY: Significant for ESRD on hemodialysis Monday, Wednesday, Friday. Finished his full dialysis. He has been on dialysis for the past 11 years.   ALLERGIES: No known allergies.   SOCIAL HISTORY: Smokes about 1-1/2 packs per week. No alcohol and occasionally uses marijuana.  PAST SURGICAL HISTORY: Significant for left arm AV fistula placement.   FAMILY HISTORY: A distant cousin has high blood pressure but no immediate family members, like mother and father, they do not have any high blood pressure or kidney disease.   HOME MEDICATIONS: (, senna 67.5 capsules p.o. t.i.d., prn for constipation. clonidine 0.3 mg p.o. b.i.d., hydralazine 100 mg p.o.  t.i.d., labetalol 200 mg 3 tablets p.o. b.i.d., losartan 100 mg p.o. daily, metoprolol tartrate 50 mg 2 tablets p.o. b.i.d., Viagra as needed, Nephro-Vite, B complex, folic acid 1 tablet daily.  REVIEW OF SYSTEMS: CONSTITUTIONAL: Has no fever. No fatigue.  EYES: No blurred vision.  ENT: No tinnitus. No epistaxis. No difficulty swallowing. RESPIRATORY: No shortness of breath. No cough.  CARDIOVASCULAR: No chest pain. No orthopnea. No PND.  GASTROINTESTINAL: No nausea. No vomiting. No diarrhea.  MUSCULOSKELETAL: No joint pain.  NEUROLOGIC: No strokes. No TIAs. No seizures.  PSYCHIATRIC: Mood and affect normal and the patient has no depression.   PHYSICAL EXAMINATION:  VITAL SIGNS: Temperature 97.8 Fahrenheit, heart rate 72, blood pressure 200/109, saturation is 99% on room air.  GENERAL: He is a 49 year old [place] American male, not in distress, answering questions appropriately.  HEAD: Atraumatic, normocephalic. Pupils equal, reacting to light. Extraocular movements are intact. ENT: No tympanic membrane congestion. No turbinate hypertrophy. No oropharyngeal erythema.  NECK: Supple. No JVD. No carotid bruit. No lymphadenopathy.  CARDIOVASCULAR: S1, S2 regular. No murmurs. The patient's PMI is not displaced. Peripheral pulses are equal in character, dorsalis pedis and femoral.  LUNGS: Clear to auscultation. No wheeze. No rales. The patient not using accessory muscles of respiration. Able to talk in full sentences.  GASTROINTESTINAL: Abdomen soft, nontender, nondistended. Bowel sounds present. No hernias. No CVA tenderness.  MUSCULOSKELETAL: The patient has a left arm AV fistula present and good thrill present. No evidence of infection. Able to move all extremities. No cyanosis, no clubbing.  NEUROLOGIC: Alert, awake,  oriented. Cranial nerves II-XII intact. Power 5/5 upper and lower extremities. Sensory intact. DTRs 2+ bilaterally. PSYCHIATRIC: Mood and affect are within normal  limits.  LABORATORY DATA: Sodium 142, potassium 3.5, chloride 98, bicarbonate 32, BUN 19, creatinine 7.3, glucose 76. WBC 6.5, hemoglobin 10, hematocrit 31.7, platelets 184,000. Troponin 0.05.  ASSESSMENT AND PLAN:  1.  A 49 year old male with malignant hypertension, has been having issues with blood pressure for a long time. The patient is going to be admitted to medical service and he is on hydralazine, labetalol, losartan, and clonidine, continue them, and I have added calcium channel blockers, Norvasc 10 mg daily and use IV hydralazine in addition to the above medications, and he is already on 2 beta blockers, but the heart rate is in the 70s, so I stopped the metoprolol. He can be on high-dose labetalol that he is doing. The patient will be on telemetry.  2.  End-stage renal disease, on hemodialysis. Consult nephrology.   TIME SPENT: About 55 minutes.    ____________________________ Epifanio Lesches, MD sk:TM D: 12/20/2014 18:35:00 ET T: 12/20/2014 19:16:41 ET JOB#: [id number]  cc: Epifanio Lesches, MD, <Dictator> Epifanio Lesches MD ELECTRONICALLY SIGNED 12/26/2014 23:39

## 2014-12-25 NOTE — Discharge Summary (Signed)
PATIENT NAME:  William Hobbs, William Hobbs MR#:  [id number] DATE OF BIRTH:  Sep 20, 1965  DATE OF ADMISSION:  12/21/2014 DATE OF DISCHARGE:  12/23/2014  PRIMARY CARE PHYSICIAN:  Mamie Levers, M.D.  FINAL DIAGNOSES:  1.  Accelerated hypertension.  2.  End-stage renal disease on hemodialysis.  3.  Anemia.  4.  Tobacco abuse.   MEDICATIONS ON DISCHARGE INCLUDE:  Hydralazine 100 mg 3 times a day, labetalol 200 mg 3 tablets twice a day, clonidine 0.3 mg twice a day, ibuprofen 800 mg 3 times a day as needed for headache, losartan 100 mg in the morning, Nephro-Vite 1 tablet daily, Viagra 100 mg once a day as needed, calcium acetate 667 mg 5 capsules 3 times a day with meals, amlodipine 10 mg 1 tablet daily, nicotine patch 7 mg per 24 hour transdermal film apply daily, minoxidil 10 mg daily, Percocet 325/5 one every 6 hours as needed for moderate pain, ipratropium nasal spray 42 mcg/inhalation nasal spray 2 sprays each nostril twice a day, stop taking metoprolol.   DIET: Renal, regular consistency.   ACTIVITY: As tolerated.   FOLLOWUP:  With dialysis Tuesday, Thursday, and Saturday; 1 to 2 weeks with your medical doctor.   HOSPITAL COURSE: The patient was admitted 12/20/2014, discharged 12/23/2014. Was admitted with elevated blood pressure.  Ended up being put on nicardipine drip.  LABORATORY AND RADIOLOGICAL DATA DURING THE HOSPITAL COURSE INCLUDED:  A troponin was 0.05. White blood cell count 6.5, hemoglobin and hematocrit 10.0 and 31.7, platelet count of 184, glucose 76, BUN 19, creatinine 7.30, sodium 142, potassium 3.5, chloride 98, CO2 32, calcium 8.9. Liver function tests normal range.  EKG: Normal sinus rhythm, left atrial enlargement, left ventricular hypertrophy. CT scan of the head: White matter lucencies in both frontal lobes probably representing small vessel disease.  Parathyroid hormone intact 163, phosphorus 3.8.  Next troponin decreased at 0.07.  EKG: Normal sinus rhythm, left atrial  enlargement, left ventricular hypertrophy.  HOSPITAL COURSE PER PROBLEM LIST:    1.  For his accelerated hypertension, much better with the addition of amlodipine and minoxidil. Follow up as outpatient. Continue usual medications.  Scripts written for amlodipine and minoxidil. He has his other medications.  2.  End-stage renal disease on hemodialysis, kept on his usual schedule.  3.  Anemia of chronic disease. Can consider Procrit with dialysis.  4.  Tobacco abuse. Smoking cessation counseling done during the hospital course. Nicotine patch prescribed upon discharge.  TIME SPENT ON DISCHARGE:  35 minutes.    ____________________________ Tana Conch. Leslye Peer, MD rjw:sp D: 12/23/2014 15:29:08 ET T: 12/24/2014 08:59:53 ET JOB#: [id number]  cc: Tana Conch. Leslye Peer, MD, <Dictator> Mamie Levers, MD [place] MD ELECTRONICALLY SIGNED 12/25/2014 7:23

## 2015-01-04 ENCOUNTER — Observation Stay
Admission: EM | Admit: 2015-01-04 | Discharge: 2015-01-05 | Disposition: A | Discharge status: Home / Self Care | Payer: Medicare Other | Attending: Internal Medicine | Admitting: Internal Medicine

## 2015-01-04 ENCOUNTER — Emergency Department: Payer: Medicare Other

## 2015-01-04 ENCOUNTER — Observation Stay
Admit: 2015-01-04 | Discharge: 2015-01-04 | Disposition: A | Discharge status: Home / Self Care | Payer: Medicare Other | Attending: Internal Medicine | Admitting: Internal Medicine

## 2015-01-04 ENCOUNTER — Encounter: Payer: Self-pay | Admitting: *Deleted

## 2015-01-04 DIAGNOSIS — Z9114 Patient's other noncompliance with medication regimen: Secondary | ICD-10-CM | POA: Insufficient documentation

## 2015-01-04 DIAGNOSIS — R531 Weakness: Secondary | ICD-10-CM | POA: Diagnosis present

## 2015-01-04 DIAGNOSIS — Z992 Dependence on renal dialysis: Secondary | ICD-10-CM | POA: Diagnosis present

## 2015-01-04 DIAGNOSIS — N186 End stage renal disease: Secondary | ICD-10-CM

## 2015-01-04 DIAGNOSIS — R002 Palpitations: Secondary | ICD-10-CM | POA: Insufficient documentation

## 2015-01-04 DIAGNOSIS — R748 Abnormal levels of other serum enzymes: Secondary | ICD-10-CM | POA: Insufficient documentation

## 2015-01-04 DIAGNOSIS — R079 Chest pain, unspecified: Secondary | ICD-10-CM

## 2015-01-04 DIAGNOSIS — I10 Essential (primary) hypertension: Secondary | ICD-10-CM | POA: Insufficient documentation

## 2015-01-04 DIAGNOSIS — I120 Hypertensive chronic kidney disease with stage 5 chronic kidney disease or end stage renal disease: Secondary | ICD-10-CM | POA: Diagnosis not present

## 2015-01-04 DIAGNOSIS — Z79899 Other long term (current) drug therapy: Secondary | ICD-10-CM | POA: Diagnosis not present

## 2015-01-04 DIAGNOSIS — Z7682 Awaiting organ transplant status: Secondary | ICD-10-CM | POA: Insufficient documentation

## 2015-01-04 DIAGNOSIS — F17210 Nicotine dependence, cigarettes, uncomplicated: Secondary | ICD-10-CM | POA: Insufficient documentation

## 2015-01-04 HISTORY — DX: Essential (primary) hypertension: I10

## 2015-01-04 HISTORY — DX: Dependence on renal dialysis: Z99.2

## 2015-01-04 HISTORY — DX: Chronic kidney disease, unspecified: N18.9

## 2015-01-04 LAB — CREATININE, SERUM
CREATININE: 9.09 mg/dL — AB (ref 0.61–1.24)
GFR, EST AFRICAN AMERICAN: 7 mL/min — AB (ref >60)
GFR, EST NON AFRICAN AMERICAN: 6 mL/min — AB (ref >60)

## 2015-01-04 LAB — BASIC METABOLIC PANEL
Anion gap: 10 (ref 5–15)
BUN: 24 mg/dL — AB (ref 6–20)
CHLORIDE: 98 mmol/L — AB (ref 101–111)
CO2: 33 mmol/L — ABNORMAL HIGH (ref 22–32)
CREATININE: 8.09 mg/dL — AB (ref 0.61–1.24)
Calcium: 9.6 mg/dL (ref 8.9–10.3)
GFR calc Af Amer: 8 mL/min — ABNORMAL LOW (ref >60)
GFR calc non Af Amer: 7 mL/min — ABNORMAL LOW (ref >60)
Glucose, Bld: 89 mg/dL (ref 65–99)
Potassium: 3.6 mmol/L (ref 3.5–5.1)
Sodium: 141 mmol/L (ref 135–145)

## 2015-01-04 LAB — CBC
HCT: 30.7 % — ABNORMAL LOW (ref 40.0–52.0)
HEMATOCRIT: 31.1 % — AB (ref 40.0–52.0)
HEMOGLOBIN: 10 g/dL — AB (ref 13.0–18.0)
HEMOGLOBIN: 9.9 g/dL — AB (ref 13.0–18.0)
MCH: 28.7 pg (ref 26.0–34.0)
MCH: 28.8 pg (ref 26.0–34.0)
MCHC: 32 g/dL (ref 32.0–36.0)
MCHC: 32.2 g/dL (ref 32.0–36.0)
MCV: 89.4 fL (ref 80.0–100.0)
MCV: 89.5 fL (ref 80.0–100.0)
PLATELETS: 194 10*3/uL (ref 150–440)
Platelets: 187 10*3/uL (ref 150–440)
RBC: 3.48 MIL/uL — ABNORMAL LOW (ref 4.40–5.90)
RBC: 3.43 MIL/uL — ABNORMAL LOW (ref 4.40–5.90)
RDW: 15.6 % — AB (ref 11.5–14.5)
RDW: 15.6 % — ABNORMAL HIGH (ref 11.5–14.5)
WBC: 6 10*3/uL (ref 3.8–10.6)
WBC: 5.4 10*3/uL (ref 3.8–10.6)

## 2015-01-04 LAB — TROPONIN I
TROPONIN I: 0.05 ng/mL — AB (ref <0.031)
Troponin I: 0.04 ng/mL — ABNORMAL HIGH (ref <0.031)
Troponin I: 0.05 ng/mL — ABNORMAL HIGH (ref <0.031)
Troponin I: 0.05 ng/mL — ABNORMAL HIGH (ref <0.031)

## 2015-01-04 IMAGING — CR DG CHEST 2V
1 series · 2 of 2 positions shown · non-contrast
Comparison: [DATE]

CLINICAL DATA: Tachycardia, dyspnea and anterior chest pressure
shortly after dialysis this morning.

EXAM:
CHEST  2 VIEW

[Series 1: dg chest 2 view · 0.14mm/px · 2 of 2 slices shown]
[im 1/2]
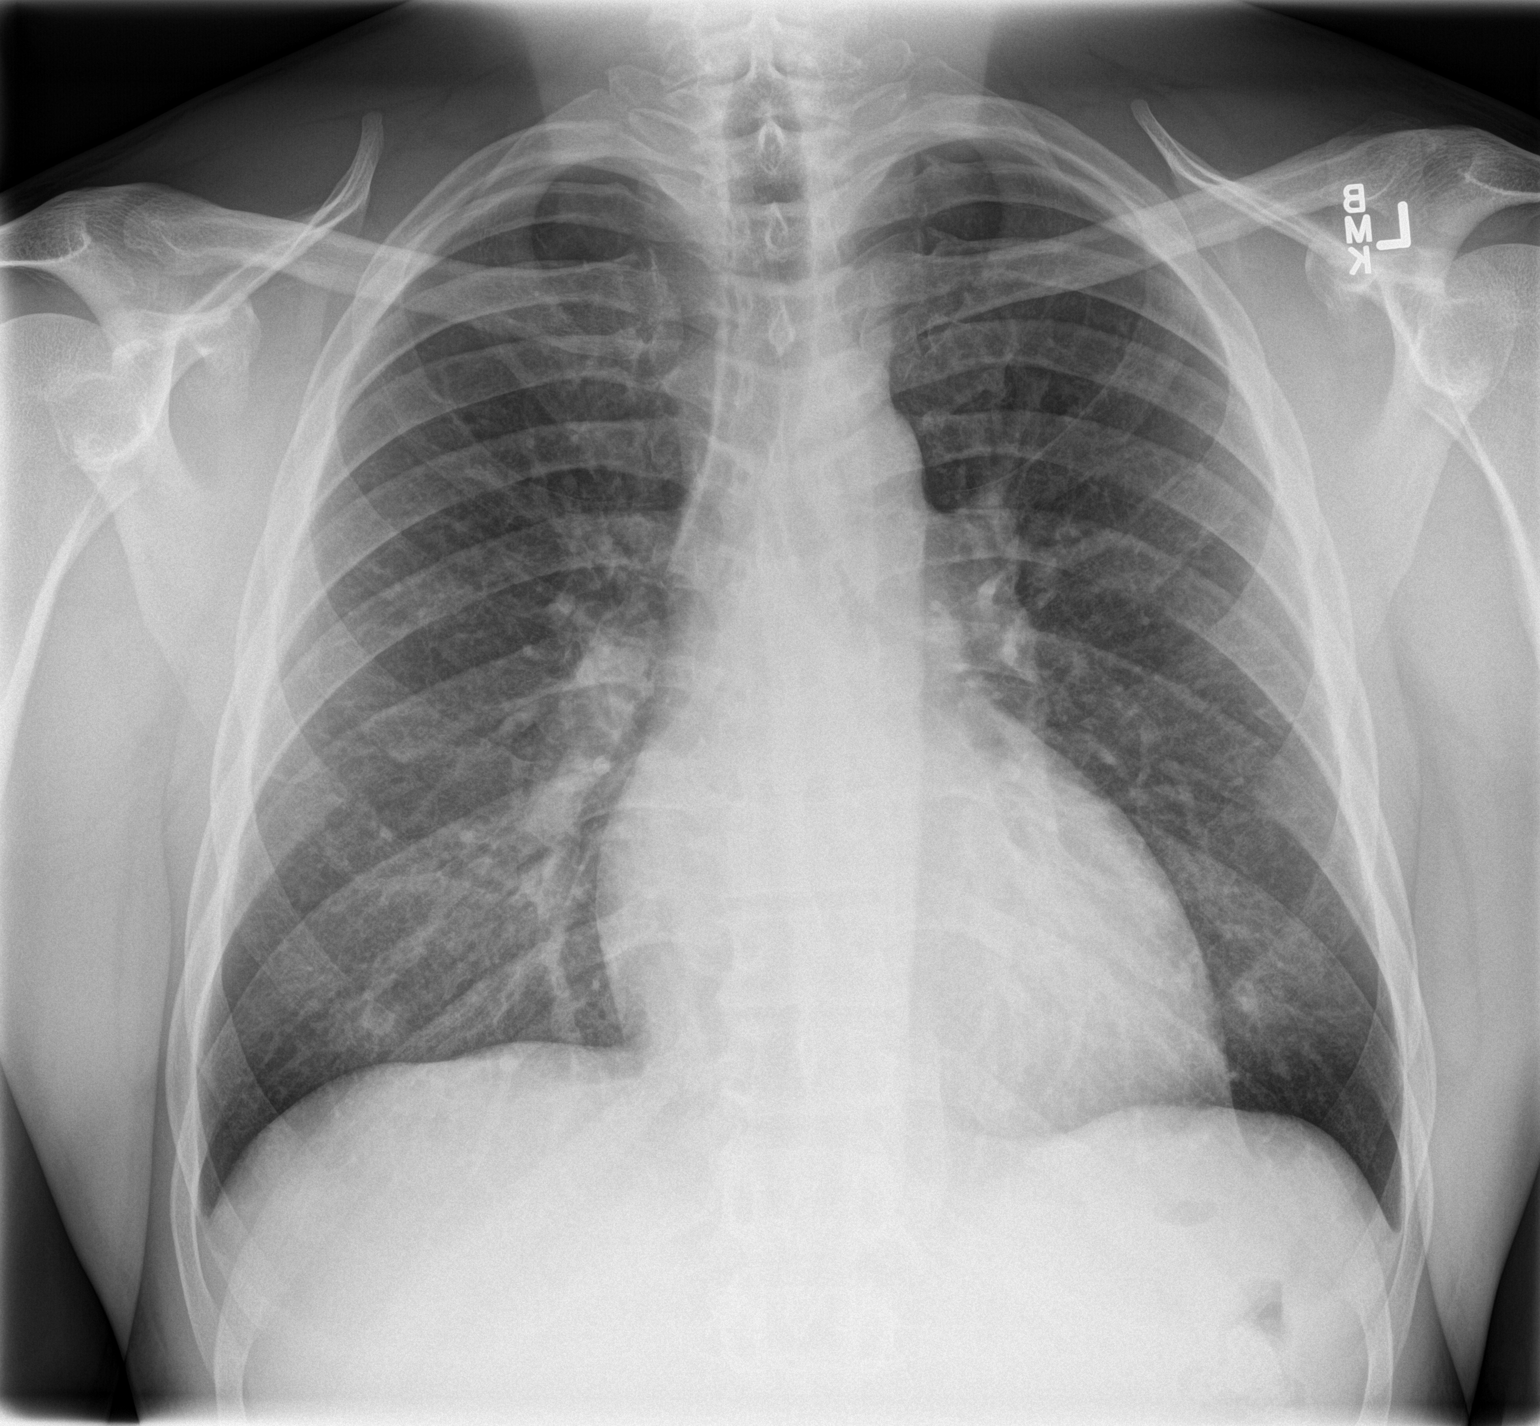
[im 2/2]
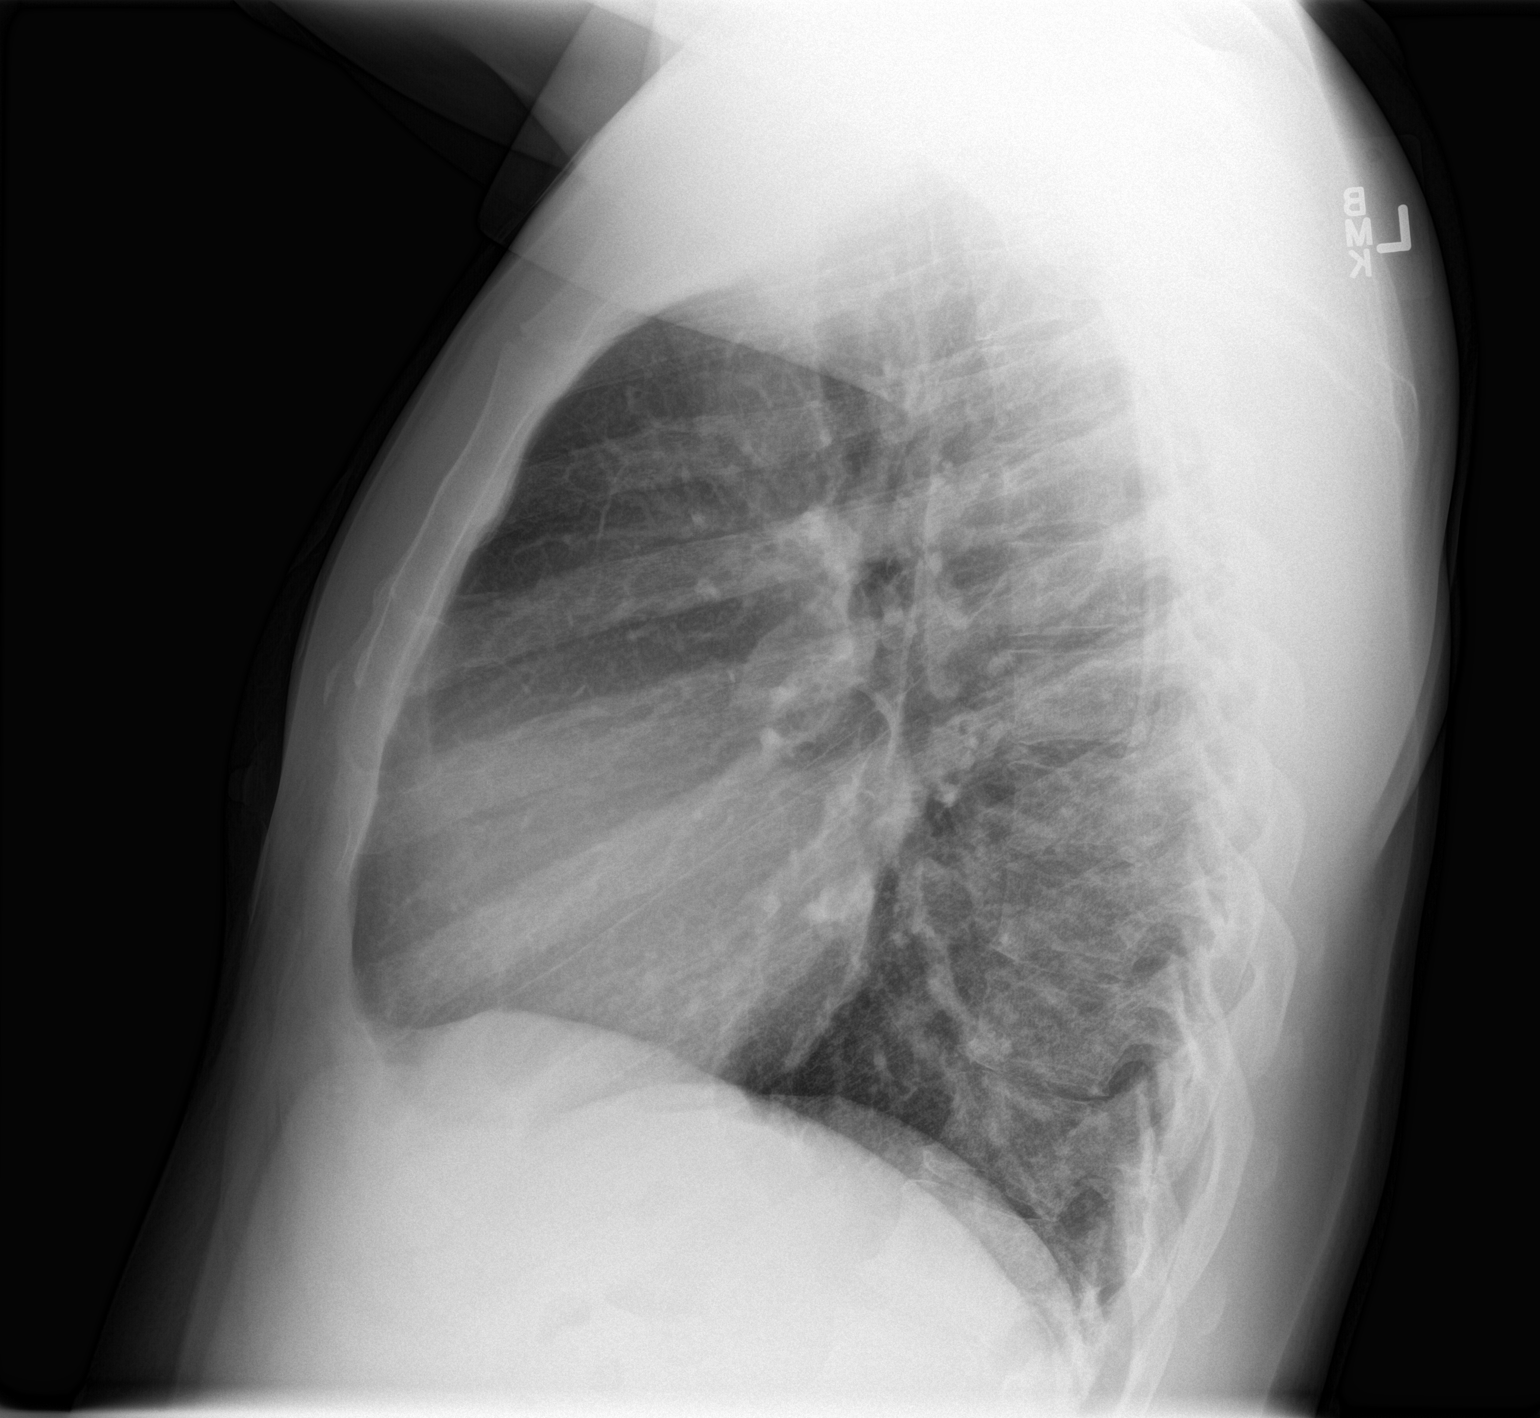

[2 of 2 positions shown; findings below may reference images not displayed]

FINDINGS: There is borderline cardiomegaly. The lungs are clear. The pulmonary
vasculature is normal. There are no pleural effusions.
IMPRESSION: Borderline cardiomegaly, unchanged.  No acute findings.

## 2015-01-04 MED ORDER — ONDANSETRON HCL 4 MG/2ML IJ SOLN
4.0000 mg | Freq: Once | INTRAMUSCULAR | Status: AC
  Administered 2015-01-04: 4 mg via INTRAVENOUS

## 2015-01-04 MED ORDER — ASPIRIN 81 MG PO CHEW
CHEWABLE_TABLET | ORAL | Status: AC
  Administered 2015-01-04: 324 mg via ORAL
  Filled 2015-01-04: qty 4

## 2015-01-04 MED ORDER — MINOXIDIL 2.5 MG PO TABS
2.5000 mg | ORAL_TABLET | Freq: Every day | ORAL | Status: DC
  Administered 2015-01-04 – 2015-01-05 (×2): 2.5 mg via ORAL
  Filled 2015-01-04 (×2): qty 1

## 2015-01-04 MED ORDER — HYDRALAZINE HCL 25 MG PO TABS
ORAL_TABLET | ORAL | Status: AC
  Administered 2015-01-04: 100 mg via ORAL
  Filled 2015-01-04: qty 4

## 2015-01-04 MED ORDER — ASPIRIN 81 MG PO CHEW
324.0000 mg | CHEWABLE_TABLET | Freq: Once | ORAL | Status: AC
  Administered 2015-01-04: 324 mg via ORAL

## 2015-01-04 MED ORDER — LOSARTAN POTASSIUM 25 MG PO TABS
ORAL_TABLET | ORAL | Status: AC
  Administered 2015-01-04: 100 mg via ORAL
  Filled 2015-01-04: qty 4

## 2015-01-04 MED ORDER — ACETAMINOPHEN 650 MG RE SUPP: 650.0000 mg | Freq: Four times a day (QID) | RECTAL | Status: DC | PRN

## 2015-01-04 MED ORDER — ONDANSETRON HCL 4 MG PO TABS: 4.0000 mg | ORAL_TABLET | Freq: Four times a day (QID) | ORAL | Status: DC | PRN

## 2015-01-04 MED ORDER — CLONIDINE HCL 0.1 MG PO TABS
0.3000 mg | ORAL_TABLET | Freq: Two times a day (BID) | ORAL | Status: DC
  Administered 2015-01-04 – 2015-01-05 (×3): 0.3 mg via ORAL
  Filled 2015-01-04 (×2): qty 3

## 2015-01-04 MED ORDER — CALCIUM ACETATE (PHOS BINDER) 667 MG PO CAPS
1334.0000 mg | ORAL_CAPSULE | Freq: Three times a day (TID) | ORAL | Status: DC
  Administered 2015-01-04 – 2015-01-05 (×2): 1334 mg via ORAL
  Filled 2015-01-04 (×7): qty 2

## 2015-01-04 MED ORDER — ACETAMINOPHEN 325 MG PO TABS: 650.0000 mg | ORAL_TABLET | Freq: Four times a day (QID) | ORAL | Status: DC | PRN

## 2015-01-04 MED ORDER — HEPARIN SODIUM (PORCINE) 5000 UNIT/ML IJ SOLN
5000.0000 [IU] | Freq: Three times a day (TID) | INTRAMUSCULAR | Status: DC
  Filled 2015-01-04: qty 1

## 2015-01-04 MED ORDER — LABETALOL HCL 200 MG PO TABS
600.0000 mg | ORAL_TABLET | Freq: Two times a day (BID) | ORAL | Status: DC
  Administered 2015-01-04 – 2015-01-05 (×3): 600 mg via ORAL
  Filled 2015-01-04 (×4): qty 3

## 2015-01-04 MED ORDER — MORPHINE SULFATE 4 MG/ML IJ SOLN
4.0000 mg | Freq: Once | INTRAMUSCULAR | Status: AC
  Administered 2015-01-04: 4 mg via INTRAVENOUS

## 2015-01-04 MED ORDER — SODIUM CHLORIDE 0.9 % IJ SOLN
3.0000 mL | INTRAMUSCULAR | Status: DC | PRN
Start: 2015-01-04 — End: 2015-01-05
  Administered 2015-01-04: 3 mL via INTRAVENOUS
  Filled 2015-01-04: qty 10

## 2015-01-04 MED ORDER — ASPIRIN EC 81 MG PO TBEC
81.0000 mg | DELAYED_RELEASE_TABLET | Freq: Every day | ORAL | Status: DC
  Administered 2015-01-05: 81 mg via ORAL
  Filled 2015-01-04: qty 1

## 2015-01-04 MED ORDER — SODIUM CHLORIDE 0.9 % IJ SOLN
3.0000 mL | Freq: Two times a day (BID) | INTRAMUSCULAR | Status: DC
  Administered 2015-01-04: 3 mL via INTRAVENOUS

## 2015-01-04 MED ORDER — LOSARTAN POTASSIUM 50 MG PO TABS
100.0000 mg | ORAL_TABLET | Freq: Every day | ORAL | Status: DC
  Administered 2015-01-04 – 2015-01-05 (×2): 100 mg via ORAL
  Filled 2015-01-04: qty 2

## 2015-01-04 MED ORDER — ONDANSETRON HCL 4 MG/2ML IJ SOLN
INTRAMUSCULAR | Status: AC
  Administered 2015-01-04: 4 mg via INTRAVENOUS
  Filled 2015-01-04: qty 2

## 2015-01-04 MED ORDER — HYDROCODONE-ACETAMINOPHEN 5-325 MG PO TABS: 1.0000 | ORAL_TABLET | ORAL | Status: DC | PRN

## 2015-01-04 MED ORDER — RENA-VITE PO TABS
1.0000 | ORAL_TABLET | Freq: Every day | ORAL | Status: DC
  Administered 2015-01-04: 1 via ORAL
  Filled 2015-01-04 (×2): qty 1

## 2015-01-04 MED ORDER — MORPHINE SULFATE 2 MG/ML IJ SOLN
1.0000 mg | INTRAMUSCULAR | Status: DC | PRN
  Administered 2015-01-04: 1 mg via INTRAVENOUS
  Filled 2015-01-04: qty 1

## 2015-01-04 MED ORDER — ASPIRIN 81 MG PO CHEW
CHEWABLE_TABLET | ORAL | Status: AC
Start: 2015-01-04 — End: 2015-01-05
  Filled 2015-01-04: qty 1

## 2015-01-04 MED ORDER — MORPHINE SULFATE 4 MG/ML IJ SOLN
INTRAMUSCULAR | Status: AC
  Administered 2015-01-04: 4 mg via INTRAVENOUS
  Filled 2015-01-04: qty 1

## 2015-01-04 MED ORDER — SODIUM CHLORIDE 0.9 % IV SOLN: 250.0000 mL | INTRAVENOUS | Status: DC | PRN

## 2015-01-04 MED ORDER — CLONIDINE HCL 0.1 MG PO TABS
ORAL_TABLET | ORAL | Status: AC
  Administered 2015-01-04: 0.3 mg via ORAL
  Filled 2015-01-04: qty 3

## 2015-01-04 MED ORDER — FLUTICASONE PROPIONATE 50 MCG/ACT NA SUSP
2.0000 | Freq: Two times a day (BID) | NASAL | Status: DC
  Administered 2015-01-04: 2 via NASAL
  Filled 2015-01-04: qty 16

## 2015-01-04 MED ORDER — ONDANSETRON HCL 4 MG/2ML IJ SOLN
4.0000 mg | Freq: Four times a day (QID) | INTRAMUSCULAR | Status: DC | PRN
Start: 2015-01-04 — End: 2015-01-05
  Administered 2015-01-04: 4 mg via INTRAVENOUS
  Filled 2015-01-04: qty 2

## 2015-01-04 MED ORDER — HYDRALAZINE HCL 50 MG PO TABS
100.0000 mg | ORAL_TABLET | Freq: Three times a day (TID) | ORAL | Status: DC
  Administered 2015-01-04 – 2015-01-05 (×4): 100 mg via ORAL
  Filled 2015-01-04 (×6): qty 2

## 2015-01-04 MED ORDER — HEPARIN SODIUM (PORCINE) 5000 UNIT/ML IJ SOLN
INTRAMUSCULAR | Status: AC
  Filled 2015-01-04: qty 1

## 2015-01-04 NOTE — ED Notes (Signed)
MD at bedside. 

## 2015-01-04 NOTE — ED Notes (Signed)
Pt reports onset of chest pain after dialysis today, with sob and dizziness.

## 2015-01-04 NOTE — ED Notes (Signed)
Pt resting in bed; waiting for admitting MD; understands he will need to be on the telemetry floor and there are no available beds on the unit at this time; pt and family know it will be some time today when he is moved to the floor;

## 2015-01-04 NOTE — ED Notes (Signed)
Charge nurse notified of troponin 0.05 and bed assignment requested

## 2015-01-04 NOTE — Progress Notes (Signed)
[facility] NAME: William Hobbs    MR#:  [id number]  DATE OF BIRTH:  09/18/1965  SUBJECTIVE:  CHIEF COMPLAINT:   Chief Complaint  Patient presents with  . Chest Pain  Having minimal chest pains now, wanting viagra and sialis samples from hospital pharmacy  REVIEW OF SYSTEMS:  Review of Systems  Constitutional: Negative for fever, chills and weight loss.  HENT: Negative for nosebleeds and sore throat.   Eyes: Negative for blurred vision.  Respiratory: Negative for cough, shortness of breath and wheezing.   Cardiovascular: Positive for chest pain. Negative for orthopnea, leg swelling and PND.  Gastrointestinal: Negative for heartburn, nausea, vomiting, abdominal pain, diarrhea and constipation.  Genitourinary: Negative for dysuria and urgency.  Musculoskeletal: Negative for back pain.  Skin: Negative for rash.  Neurological: Negative for dizziness, speech change, focal weakness and headaches.  Endo/Heme/Allergies: Does not bruise/bleed easily.  Psychiatric/Behavioral: Negative for depression.    DRUG ALLERGIES:  No Known Allergies  VITALS:  Blood pressure 133/68, pulse 70, temperature 98.1 F (36.7 C), resp. rate 47, height 6\' 1"  (1.854 m), weight 99.4 kg (219 lb 2.2 oz), SpO2 97 %.  PHYSICAL EXAMINATION:  GENERAL:  49 y.o.-year-old patient lying in the bed with no acute distress.  EYES: Pupils equal, round, reactive to light and accommodation. No scleral icterus. Extraocular muscles intact.  HEENT: Head atraumatic, normocephalic. Oropharynx and nasopharynx clear.  NECK:  Supple, no jugular venous distention. No thyroid enlargement, no tenderness.  LUNGS: Normal breath sounds bilaterally, no wheezing, rales,rhonchi or crepitation. No use of accessory muscles of respiration.  CARDIOVASCULAR: S1, S2 normal. No murmurs, rubs, or gallops.  ABDOMEN: Soft, nontender, nondistended. Bowel sounds present. No organomegaly or  mass.  EXTREMITIES: No pedal edema, cyanosis, or clubbing.  NEUROLOGIC: Cranial nerves II through XII are intact. Muscle strength 5/5 in all extremities. Sensation intact. Gait not checked.  PSYCHIATRIC: The patient is alert and oriented x 3.  SKIN: No obvious rash, lesion, or ulcer.    LABORATORY PANEL:   CBC  Recent Labs Lab 01/04/15 1016  WBC 5.4  HGB 9.9*  HCT 30.7*  PLT 187   ------------------------------------------------------------------------------------------------------------------  Chemistries   Recent Labs Lab 01/04/15 0050 01/04/15 1016  NA 141  --   K 3.6  --   CL 98*  --   CO2 33*  --   GLUCOSE 89  --   BUN 24*  --   CREATININE 8.09* 9.09*  CALCIUM 9.6  --    ------------------------------------------------------------------------------------------------------------------  Cardiac Enzymes  Recent Labs Lab 01/04/15 1016  TROPONINI 0.05*   ------------------------------------------------------------------------------------------------------------------  RADIOLOGY:  Dg Chest 2 View  01/04/2015   CLINICAL DATA:  Tachycardia, dyspnea and anterior chest pressure shortly after dialysis this morning.  EXAM: CHEST  2 VIEW  COMPARISON:  11/19/2011  FINDINGS: There is borderline cardiomegaly. The lungs are clear. The pulmonary vasculature is normal. There are no pleural effusions.  IMPRESSION: Borderline cardiomegaly, unchanged.  No acute findings.   Electronically Signed   By: Andreas Newport M.D.   On: 01/04/2015 01:04    ASSESSMENT AND PLAN:   1. Chest pain with associated palpitations, atypical in nature. Rule out acute coronary event. continue telemetry, aspirin, beta blocker. Cycle cardiac enzymes. await echocardiogram. Cardio c/s 2. Elevated troponin, mild, pt has chronic elevation of troponin, likely related to ESRD.  3. Hypertension, stable and controlled on home medications. Continue home medications. 4. End-stage renal disease on  hemodialysis Monday  Wednesday Friday. Stable. Continue HD. Nephrology consult placed.   Code Status: Full code  All the records are reviewed and case discussed with Care Management/Social Workerr. Management plans discussed with the patient, family and they are in agreement.  CODE STATUS: full code  TOTAL TIME TAKING CARE OF THIS PATIENT: 25 minutes.   POSSIBLE D/C IN am, DEPENDING ON CLINICAL CONDITION and cardio w/up   [name] M.D on 01/04/2015 at 2:38 PM  Between 7am to 6pm - Pager - [phone]  After 6pm go to [email or web address] - password [email or web address] [facility]  [place] [facility]  Office  [phone]  CC: Primary care physician; No primary care provider on file.

## 2015-01-04 NOTE — Progress Notes (Signed)
Patient admitted to unit. Oriented to room, call bell, and staff. Bed in lowest position. Fall safety plan reviewed. Full assessment to Epic. Will continue to monitor.  Toniann Ket

## 2015-01-04 NOTE — ED Notes (Signed)
Pt given sandwich tray as allowed by Dr Beather Arbour

## 2015-01-04 NOTE — Progress Notes (Signed)
Alert and oriented. Sinus rhythm on telemetry. Rested quietly most of the afternoon. No complaints.. Full assessment as charted. MWF dialysis patient - will likely get dialysis tomorrow.  Toniann Ket

## 2015-01-04 NOTE — ED Notes (Signed)
Pt awake and alert; talking in complete coherant sentences; says he was discharged from [facility] Saturday after staying 5 days for HTN; pts pressure lower than he's used to; says had dialysis today as scheduled and started feeling bad after he left; short of breath; talking in complete coherant sentences

## 2015-01-04 NOTE — ED Provider Notes (Signed)
[facility] Emergency Department Provider Note  ____________________________________________  Time seen: Approximately 3:14 AM  I have reviewed the triage vital signs and the nursing notes.   HISTORY  Chief Complaint Chest Pain    HPI William Hobbs. is a 49 y.o. male who presents with left-sided chest pain onset this evening after dialysis. Patient recently discharged from the hospital for malignant hypertension; released on additional antihypertensive. Patient states he feels like his blood pressure is too low as he is experiencing generalized malaise, fatigue, decreased appetite since starting additional blood pressure medicines.  Patient describes 8/10 left sided chest aching, pressure waxing and waning associated with palpitations, diaphoresis, dizziness and shortness of breath.Patient denies nausea and vomiting. Nothing exacerbates or brings on the pain; nothing relieves the pain. Patient denies history of same or history of coronary artery disease.   Past Medical History  Diagnosis Date  . Dialysis patient   . Hypertension   . Chronic kidney disease     Patient Active Problem List   Diagnosis Date Noted  . Chest pain 01/04/2015  . HTN (hypertension) 01/04/2015  . ESRD on hemodialysis 01/04/2015    Past Surgical History  Procedure Laterality Date  . Av fistula placement      Current Outpatient Rx  Name  Route  Sig  Dispense  Refill  . b complex-vitamin c-folic acid (NEPHRO-VITE) 0.8 MG TABS tablet   Oral   Take 1 tablet by mouth daily.         . calcium acetate (PHOSLO) 667 MG capsule   Oral   Take 1,334 mg by mouth 3 (three) times daily with meals.         . cloNIDine (CATAPRES) 0.3 MG tablet   Oral   Take 0.3 mg by mouth 2 (two) times daily.         . hydrALAZINE (APRESOLINE) 100 MG tablet   Oral   Take 100 mg by mouth 3 (three) times daily.         Marland Kitchen labetalol (NORMODYNE) 200 MG tablet   Oral   Take 600 mg by  mouth 2 (two) times daily.         Marland Kitchen losartan (COZAAR) 100 MG tablet   Oral   Take 100 mg by mouth daily.         . minoxidil (LONITEN) 2.5 MG tablet   Oral   Take 2.5 mg by mouth daily.         . sildenafil (VIAGRA) 100 MG tablet   Oral   Take 100 mg by mouth daily as needed for erectile dysfunction.           Allergies Review of patient's allergies indicates no known allergies.  History reviewed. No pertinent family history.  Social History History  Substance Use Topics  . Smoking status: Current Every Day Smoker  . Smokeless tobacco: Not on file  . Alcohol Use: No    Review of Systems Constitutional: No fever/chills. Positive for generalized malaise. Eyes: No visual changes. ENT: No sore throat. Cardiovascular: Positive for chest pain. Respiratory: Positive for shortness of breath. Gastrointestinal: No abdominal pain.  No nausea, no vomiting.  No diarrhea.  No constipation. Genitourinary: Negative for dysuria. Musculoskeletal: Negative for back pain. Skin: Negative for rash. Neurological: Negative for headaches, focal weakness or numbness.  10-point ROS otherwise negative.  ____________________________________________   PHYSICAL EXAM:  VITAL SIGNS: ED Triage Vitals  Enc Vitals Group     BP 01/04/15 0044 121/51 mmHg  Pulse Rate 01/04/15 0044 73     Resp 01/04/15 0044 16     Temp 01/04/15 0044 98.1 F (36.7 C)     Temp src --      SpO2 01/04/15 0044 98 %     Weight 01/04/15 0044 219 lb 2.2 oz (99.4 kg)     Height 01/04/15 0044 6\' 1"  (1.854 m)     Head Cir --      Peak Flow --      Pain Score 01/04/15 0045 10     Pain Loc --      Pain Edu? --      Excl. in [place]? --     Constitutional: Alert and oriented. Appears fatigued. Eyes: Conjunctivae are normal. PERRL. EOMI. Head: Atraumatic. Nose: No congestion/rhinnorhea. Mouth/Throat: Mucous membranes are moist.  Oropharynx non-erythematous. Neck: No stridor.   Cardiovascular: Normal rate,  regular rhythm. Grossly normal heart sounds.  Good peripheral circulation. Respiratory: Shallow respiratory effort.  No retractions. Lungs CTAB. Gastrointestinal: Soft and nontender. No distention. No abdominal bruits. No CVA tenderness. Musculoskeletal: No lower extremity tenderness nor edema.  No joint effusions. Left upper extremity AV fistula with palpable thrill noted. Neurologic:  Normal speech and language. No gross focal neurologic deficits are appreciated. Speech is normal. No gait instability. Skin:  Skin is warm, dry and intact. No rash noted. Psychiatric: Mood and affect are normal. Speech and behavior are normal.  ____________________________________________   LABS (all labs ordered are listed, but only abnormal results are displayed)  Labs Reviewed  CBC - Abnormal; Notable for the following:    RBC 3.48 (*)    Hemoglobin 10.0 (*)    HCT 31.1 (*)    RDW 15.6 (*)    All other components within normal limits  BASIC METABOLIC PANEL - Abnormal; Notable for the following:    Chloride 98 (*)    CO2 33 (*)    BUN 24 (*)    Creatinine, Ser 8.09 (*)    GFR calc non Af Amer 7 (*)    GFR calc Af Amer 8 (*)    All other components within normal limits  TROPONIN I - Abnormal; Notable for the following:    Troponin I 0.05 (*)    All other components within normal limits   ____________________________________________  EKG  ED ECG REPORT   Date: 01/04/2015  EKG Time: 00 40  Rate: 78  Rhythm: normal EKG, normal sinus rhythm, frequent PVC's noted  Axis: Normal  Intervals:none  ST&T Change: T-wave inversion inferior lateral leads  ____________________________________________  RADIOLOGY  Chest 2 view (reviewed by me, interpreted by Dr. Alroy Dust): Borderline cardiomegaly, unchanged. No acute findings. ____________________________________________   PROCEDURES  Procedure(s) performed: None  Critical Care performed:  No  ____________________________________________   INITIAL IMPRESSION / ASSESSMENT AND PLAN / ED COURSE  Pertinent labs & imaging results that were available during my care of the patient were reviewed by me and considered in my medical decision making (see chart for details).  49 year old male with waxing and waning chest pain since hemodialysis this afternoon. Review of chart notes chronically elevated troponins. However, concern for frequent PVCs seen on cardiac monitor as well as marginal blood pressure in a patient whose previous baseline systolic blood pressure was 180. Will administer aspirin, analgesia; discuss with hospitalist for hospital admission. ____________________________________________   FINAL CLINICAL IMPRESSION(S) / ED DIAGNOSES  Final diagnoses:  Chest pain, unspecified chest pain type  End stage renal disease on dialysis  Weakness  Paulette Blanch, MD 01/04/15 [phone]

## 2015-01-04 NOTE — ED Notes (Signed)
Pt c/o feeling like his heart is beating irregularly; shortness of breath; denies nausea and vomiting;

## 2015-01-04 NOTE — H&P (Addendum)
[facility] History and Physical  Tonia Clowe [id number] DOB: 1965-09-27 DOA: 01/04/2015  Referring physician: Lurline Hare PCP: No primary care provider on file. Dr. Juleen China.  Chief Complaint: Palpitations with chest discomfort  HPI: William Hobbs Sr. is a 49 y.o. male with below past medical history presents to the emergency room with the complaints of feeling of palpitations with chest discomfort following undergoing hemodialysis yesterday. Denies any radiation of pain, no associated shortness of breath, dizziness or loss of consciousness. Denies any nausea, vomiting, diarrhea, constipation, abdominal pain. In the emergency room patient was evaluated by the ED physician and workup revealed a troponin of 0.05 which is in his usual range of mild elevation, EKG with no acute ischemic changes but positive for frequent PVCs. Patient was given IV morphine following which his chest pain is resolved now.  Past Medical History  Diagnosis Date  . Dialysis patient   . Hypertension   . Chronic kidney disease     Past Surgical History  Procedure Laterality Date  . Av fistula placement      History reviewed. No pertinent family history.  Social History  reports that he has been smoking.  He does not have any smokeless tobacco history on file. He reports that he uses illicit drugs (Marijuana). He reports that he does not drink alcohol.  No Known Allergies  Prior to Admission medications   Medication Sig Start Date End Date Taking? Authorizing Provider  b complex-vitamin c-folic acid (NEPHRO-VITE) 0.8 MG TABS tablet Take 1 tablet by mouth daily.   Yes Historical Provider, MD  calcium acetate (PHOSLO) 667 MG capsule Take 1,334 mg by mouth 3 (three) times daily with meals.   Yes Historical Provider, MD  cloNIDine (CATAPRES) 0.3 MG tablet Take 0.3 mg by mouth 2 (two) times daily.   Yes Historical Provider, MD  hydrALAZINE (APRESOLINE) 100 MG tablet Take 100 mg by mouth 3 (three)  times daily.   Yes Historical Provider, MD  labetalol (NORMODYNE) 200 MG tablet Take 600 mg by mouth 2 (two) times daily.   Yes Historical Provider, MD  losartan (COZAAR) 100 MG tablet Take 100 mg by mouth daily.   Yes Historical Provider, MD  minoxidil (LONITEN) 2.5 MG tablet Take 2.5 mg by mouth daily.   Yes Historical Provider, MD  sildenafil (VIAGRA) 100 MG tablet Take 100 mg by mouth daily as needed for erectile dysfunction.    Historical Provider, MD     Review of Systems:  Constitutional: No fevers, chills, fatigue, weakness.  Eyes: No blurred or double vision, pain, redness, inflammation. ENT: No tinnitus, ear pain, hearing loss, epistaxis, discharge, redness of oropharynx, difficulty swallowing. Resp: No cough, wheeze, hemoptysis, dyspnea, painful respiration. Cardio-vascular: Positive for palpitations and chest discomfort earlier but now no chest pain. No orthopnea, edema, DOE, syncope. GI: No nausea, vomiting, diarrhea, abdominal pain, hematemesis, melena, GERD, rectal bleeding, constipation. GU: No dysuria, hematuria, urgency, frequency, incontinence Endocrine: No polyuria, nocturia, heat or cold intolerance, thirst Hematologic/Lymphatic: No anemia, easy bruising bleeding, swollen glands Integumentary: No acne, rash, lesions. Musculoskeletal: No pain in: neck-back-shoulder-knee-hip, arthritis, gout, redness. Neuro: No numbness, weakness, dysarthria, epilepsy, tremor, vertigo, ataxia, dementia, headache, migraine, CVA, TIA, seizure, memory loss Psych: No anxiety, insomnia, ADD, OCD, bipolar, depression, schizophrenia.   Physical Exam: Constitutional: Filed Vitals:   01/04/15 0044 01/04/15 0243 01/04/15 0330 01/04/15 0400  BP: 121/51 130/74 132/74 144/73  Pulse: 73 63  76  Temp: 98.1 F (36.7 C)     Resp: 16  18  16  Height: 6\' 1"  (1.854 m)     Weight: 99.4 kg (219 lb 2.2 oz)     SpO2: 98% 98%  99%   Wt Readings from Last 3 Encounters:  01/04/15 99.4 kg (219 lb 2.2 oz)   12/22/14 100.699 kg (222 lb)   General:  Well developed, well nourished, in no apparent distress HEENT: PERRL, EOMI, no scleral icterus, no conjunctivitis, no difficulty hearing, no pharyngeal erythema, Mucosa - moist. Neck:  Supple, no masses, non tender, no adenopathy, no JVD, no Carotid bruit, no thyromegaly,FROM Cardiovascular: RRR, no m/r/g, no S3/S4, chest wall nontender, good pedal pulses, good femoral pulses, no LE edema. Respiratory: CTA bilaterally, no wheeze, no rales, no ronchi, breath sounds not diminished, breathing not labored, no increased respiratory effort. Abdomen: soft, nontender, nondistended, no mass, bowel sounds present and normoactive, no hepatosplenomegaly. GU/Gyn: Not examined. Musculoskeletal: 5/5 muscular strength x4 extremities, no cyanosis/clubbing. AV fistula on the left forearm. Skin: no rash, no lesions, no erythema. Lymphatic: No adenopathy (cervical/axilla/inguinal/supraclavicular) Neurologic: Cranial nerves intact, DTR intact, Sensation intact, Babinski sign R/L, no dysarthria, no aphasia, no dysphagia, no contractures Psychiatric: Alert, Oriented (time, person, place, circumstance), Cooperative, Judgement good, Memory intact, not confused, not agitated, not depressed, cooperative         Labs on Admission:  Basic Metabolic Panel:  Recent Labs Lab 01/04/15 0050  NA 141  K 3.6  CL 98*  CO2 33*  GLUCOSE 89  BUN 24*  CREATININE 8.09*  CALCIUM 9.6   Liver Function Tests: No results for input(s): AST, ALT, ALKPHOS, BILITOT, PROT, ALBUMIN in the last 168 hours. No results for input(s): LIPASE, AMYLASE in the last 168 hours. No results for input(s): AMMONIA in the last 168 hours. CBC:  Recent Labs Lab 01/04/15 0050  WBC 6.0  HGB 10.0*  HCT 31.1*  MCV 89.5  PLT 194   Cardiac Enzymes:  Recent Labs Lab 01/04/15 0050  TROPONINI 0.05*   BNP (last 3 results) No results for input(s): BNP in the last 8760 hours.  ProBNP (last 3  results) No results for input(s): PROBNP in the last 8760 hours.  CBG: No results for input(s): GLUCAP in the last 168 hours.  Radiological Exams on Admission: Dg Chest 2 View  01/04/2015   CLINICAL DATA:  Tachycardia, dyspnea and anterior chest pressure shortly after dialysis this morning.  EXAM: CHEST  2 VIEW  COMPARISON:  11/19/2011  FINDINGS: There is borderline cardiomegaly. The lungs are clear. The pulmonary vasculature is normal. There are no pleural effusions.  IMPRESSION: Borderline cardiomegaly, unchanged.  No acute findings.   Electronically Signed   By: Andreas Newport M.D.   On: 01/04/2015 01:04    EKG: Independently reviewed. Normal sinus rhythm with ventricular rate of 78 bpm. Frequent PVCs present.  Assessment/Plan Principal Problem:   Chest pain Active Problems:   HTN (hypertension)   ESRD on hemodialysis   1. Chest pain with associated palpitations, atypical in nature. Rule out acute coronary event. Plan: Admit to telemetry, aspirin, beta blocker. Cycle cardiac enzymes. Order echocardiogram. Consider further workup accordingly. 2. Elevated troponin, mild, pt has chronic elevation of troponin, likely related to ESRD. Cycle CE's, f/u accordingly.  3. Hypertension, stable and controlled on home medications. Continue home medications. 4. End-stage renal disease on hemodialysis Monday Wednesday Friday. Stable. Continue HD. Nephrology consult placed.   Code Status: Full code DVT Prophylaxis: SubQ heparin.  Time spent on admission: 50 minutes.  Juluis Mire [facility]

## 2015-01-05 DIAGNOSIS — R7989 Other specified abnormal findings of blood chemistry: Secondary | ICD-10-CM | POA: Diagnosis not present

## 2015-01-05 DIAGNOSIS — R002 Palpitations: Secondary | ICD-10-CM

## 2015-01-05 DIAGNOSIS — R079 Chest pain, unspecified: Secondary | ICD-10-CM | POA: Diagnosis not present

## 2015-01-05 MED ORDER — FLUTICASONE PROPIONATE 50 MCG/ACT NA SUSP: 2.0000 | Freq: Two times a day (BID) | NASAL | Status: AC

## 2015-01-05 MED ORDER — DILTIAZEM HCL ER COATED BEADS 120 MG PO CP24: 120.0000 mg | ORAL_CAPSULE | Freq: Every day | ORAL | Status: DC

## 2015-01-05 NOTE — Consult Note (Signed)
Cardiology Consultation Note  Patient ID: William Denhart., MRN: [id number], DOB/AGE: February 26, 1966 49 y.o. Admit date: 01/04/2015   Date of Consult: 01/05/2015 Primary Physician: No primary care provider on file. Primary Cardiologist: New to [facility]  Chief Complaint: Palpitations and chest pain associated with dialysis  Reason for Consult: Palpitations and elevated troponin   HPI: 49 y.o. male with h/o ESRD presumed 2/2 HTN on HD and polysubstance who is on the transplant list at [facility] abuse who presented to [facility] on 5/12 with palpitations and chest pain after undergoing dialysis.   He has no known previous cardiac history and has never been seen by a cardiologist before. He reports over the past several months after finishing his dialysis sessions he will develop palpitations with associated chest pressure. These symptoms are self limiting and he is able to go on about his day, which in the warm months he reports includes mowing the lawn without any complaints. There is no associated SOB, diaphoresis, nausea, vomiting, presyncope, or syncope. He reports this past time he just got tired of dealing with these symptoms and wanted something done so he came to the ED.   He reports his baseline BP runs in the [id number] systolic. He reports BP during HD in the 1-teens systolic. He states he takes too many medications which lead to his ED and he wants to get off these medications and he is fearful his wife will leave him. Upon his arrival to [facility] ED he was found to have troponin of 0.05-->0.05-->0.04-->0.05. This is at his baseline given his ESRD on HD. EKG showed NSR, 78 bpm, frequent PVCs, TWI V5-V6 (old), and nonspecific st/t changes aVF. No PVCs on tele. He has been without palpitations or chest pain since his admission.    Past Medical History  Diagnosis Date  . Dialysis patient   . Hypertension   . Chronic kidney disease       Most Recent Cardiac Studies: Echo pending   Surgical History:  Past  Surgical History  Procedure Laterality Date  . Av fistula placement       Home Meds: Prior to Admission medications   Medication Sig Start Date End Date Taking? Authorizing Provider  b complex-vitamin c-folic acid (NEPHRO-VITE) 0.8 MG TABS tablet Take 1 tablet by mouth daily.   Yes Historical Provider, MD  calcium acetate (PHOSLO) 667 MG capsule Take 1,334 mg by mouth 3 (three) times daily with meals.   Yes Historical Provider, MD  cloNIDine (CATAPRES) 0.3 MG tablet Take 0.3 mg by mouth 2 (two) times daily.   Yes Historical Provider, MD  hydrALAZINE (APRESOLINE) 100 MG tablet Take 100 mg by mouth 3 (three) times daily.   Yes Historical Provider, MD  labetalol (NORMODYNE) 200 MG tablet Take 600 mg by mouth 2 (two) times daily.   Yes Historical Provider, MD  losartan (COZAAR) 100 MG tablet Take 100 mg by mouth daily.   Yes Historical Provider, MD  minoxidil (LONITEN) 2.5 MG tablet Take 2.5 mg by mouth daily.   Yes Historical Provider, MD  sildenafil (VIAGRA) 100 MG tablet Take 100 mg by mouth daily as needed for erectile dysfunction.    Historical Provider, MD    Inpatient Medications:  . aspirin EC  81 mg Oral Daily  . calcium acetate  1,334 mg Oral TID WC  . cloNIDine  0.3 mg Oral BID  . fluticasone  2 spray Each Nare BID  . heparin  5,000 Units Subcutaneous 3 times per day  .  hydrALAZINE  100 mg Oral TID  . labetalol  600 mg Oral BID  . losartan  100 mg Oral Daily  . minoxidil  2.5 mg Oral Daily  . multivitamin  1 tablet Oral QHS  . sodium chloride  3 mL Intravenous Q12H  . sodium chloride  3 mL Intravenous Q12H      Allergies: No Known Allergies  History   Social History  . Marital Status: Married    Spouse Name: N/A  . Number of Children: N/A  . Years of Education: N/A   Occupational History  . Not on file.   Social History Main Topics  . Smoking status: Current Every Day Smoker  . Smokeless tobacco: Not on file  . Alcohol Use: No  . Drug Use: Yes    Special:  Marijuana  . Sexual Activity: Not on file   Other Topics Concern  . Not on file   Social History Narrative  . No narrative on file     History reviewed. No pertinent family history.   Review of Systems: Review of Systems  Constitutional: Negative for fever, chills, weight loss, malaise/fatigue and diaphoresis.  Eyes: Negative for discharge and redness.  Respiratory: Negative for cough, hemoptysis, sputum production, shortness of breath and wheezing.   Cardiovascular: Positive for chest pain and palpitations. Negative for orthopnea, claudication, leg swelling and PND.  Gastrointestinal: Negative for heartburn, nausea, vomiting and abdominal pain.  Genitourinary:       Positive for ED  Musculoskeletal: Negative for myalgias.  Neurological: Negative for sensory change, focal weakness and weakness.  Psychiatric/Behavioral: The patient is not nervous/anxious.      Labs:  Recent Labs  01/04/15 0050 01/04/15 1016 01/04/15 1419 01/04/15 2211  TROPONINI 0.05* 0.05* 0.04* 0.05*   Lab Results  Component Value Date   WBC 5.4 01/04/2015   HGB 9.9* 01/04/2015   HCT 30.7* 01/04/2015   MCV 89.4 01/04/2015   PLT 187 01/04/2015    Recent Labs Lab 01/04/15 0050 01/04/15 1016  NA 141  --   K 3.6  --   CL 98*  --   CO2 33*  --   BUN 24*  --   CREATININE 8.09* 9.09*  CALCIUM 9.6  --   GLUCOSE 89  --    No results found for: CHOL, HDL, LDLCALC, TRIG No results found for: DDIMER  Radiology/Studies:  Dg Chest 2 View  01/04/2015   CLINICAL DATA:  Tachycardia, dyspnea and anterior chest pressure shortly after dialysis this morning.  EXAM: CHEST  2 VIEW  COMPARISON:  11/19/2011  FINDINGS: There is borderline cardiomegaly. The lungs are clear. The pulmonary vasculature is normal. There are no pleural effusions.  IMPRESSION: Borderline cardiomegaly, unchanged.  No acute findings.   Electronically Signed   By: Andreas Newport M.D.   On: 01/04/2015 01:04   Ct Head Limited W/o  Cm  12/20/2014   CLINICAL DATA:  Hypertension.  EXAM: CT HEAD WITHOUT CONTRAST  TECHNIQUE: Contiguous axial images were obtained from the base of the skull through the vertex without intravenous contrast.  COMPARISON:  None.  FINDINGS: No mass lesion. No midline shift. No acute hemorrhage or hematoma. No extra-axial fluid collections. No evidence of acute infarction. There is subcortical white matter lucency high in the frontal lobes which most likely represents small vessel ischemic disease. Brain parenchyma is otherwise normal. No osseous abnormality.  IMPRESSION: White matter lucencies high in both frontal lobes, probably representing small vessel disease.   Electronically Signed  By: Lorriane Shire M.D.   On: 12/20/2014 19:16    EKG: NSR, 78 bpm, frequent PVCs, prolonged Qt, TWI V5-V6 (old) nonspecific st/t changes aVF  Weights: Filed Weights   01/04/15 0044 01/04/15 1546 01/05/15 0634  Weight: 219 lb 2.2 oz (99.4 kg) 219 lb 8 oz (99.565 kg) 220 lb 8 oz (100.018 kg)     Physical Exam: Blood pressure 131/60, pulse 77, temperature 98.5 F (36.9 C), temperature source Oral, resp. rate 18, height 6\' 1"  (1.854 m), weight 220 lb 8 oz (100.018 kg), SpO2 99 %. Body mass index is 29.1 kg/(m^2). General: Well developed, well nourished, in no acute distress. Head: Normocephalic, atraumatic, sclera non-icteric, no xanthomas, nares are without discharge.  Neck: Negative for carotid bruits. JVD not elevated. Lungs: Clear bilaterally to auscultation without wheezes, rales, or rhonchi. Breathing is unlabored. Heart: RRR with S1 S2. 2/6 systolic murmur best heard LUSB. No rubs or gallops appreciated. Abdomen: Soft, non-tender, non-distended with normoactive bowel sounds. No hepatomegaly. No rebound/guarding. No obvious abdominal masses. Msk:  Strength and tone appear normal for age. Extremities: No clubbing or cyanosis. No edema. Multiple tattoos.   Neuro: Alert and oriented X 3. No facial asymmetry. No  focal deficit. Moves all extremities spontaneously. Psych:  Responds to questions appropriately with a normal affect.    Assessment and Plan:  49 y.o. 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  1. Chest pain and palpitations: -Frequent PVCs seen on admission EKG, which have since improved -No further chest pain  -Echo is pending to evaluate LV function and wall motion  -If normal LV function could add Cardizem CD 120 mg daily to aid in management of PVCs with cessation of possible current antihypertensive if blood pressure becomes labile  -He is requesting Viagra or Cialis from multiple providers (pending echo possible change in therapy from BB to CCB down the road as an outpatient)  2. Elevated troponin: -Elevated troponin is at baseline for him given his ESRD on HD M,W,F. No further chest pain -Could pursue outpatient nuclear stress testing if he wishes or if he redevelops chest pain -Check echo as above -Continue aspirin 81 mg   3. HTN: -Stable -Possible addition of CCB as above   4. ESRD on HD: -Per Renal  5. 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

## 2015-01-05 NOTE — Discharge Summary (Addendum)
[place] [facility] [place] NAME: William Hobbs    MR#:  [id number]  DATE OF BIRTH:  14-May-1966  DATE OF ADMISSION:  01/04/2015 ADMITTING PHYSICIAN: [name], MD  DATE OF DISCHARGE: No discharge date for patient encounter.  PRIMARY CARE PHYSICIAN: No primary care provider on file.    ADMISSION DIAGNOSIS:  Weakness [R53.1] End stage renal disease on dialysis [N18.6, Z99.2] Chest pain, unspecified chest pain type [R07.9]  DISCHARGE DIAGNOSIS:  1. Chest pain with associated palpitations: recommend cardizem (pt not sure if he would like to take this) 2. Elevated troponin, mild, pt has chronic elevation of troponin, likely related to ESRD.  3. End-stage renal disease on hemodialysis Monday Wednesday Friday. Pt refused HD today  SECONDARY DIAGNOSIS:   Past Medical History  Diagnosis Date  . Dialysis patient   . Hypertension   . Chronic kidney disease     HOSPITAL COURSE:  Patient is a 49 year old male with medical noncompliance was admitted for chest pain with palpitation. See [name]'s dictated history and physical for further details. Patient was ruled out with 3 negative sets of troponin. Cardiology consultation was open with [name] who recommended cardiac echocardiogram which was obtained and was within normal limits. Also recommended Cardizem CD to control his flutter. patient was not quite willing to take the medication although he agreed to keep the prescription for now. He refused dialysis which was scheduled as an inpatient today per his Monday Wednesday Friday schedule.   1. Chest pain with associated palpitations: recommend cardizem (pt not sure if he would like to take this) 2. Elevated troponin, mild, pt has chronic elevation of troponin, likely related to ESRD.  3. End-stage renal disease on hemodialysis Monday Wednesday Friday. Pt refused HD today  his discharge plan was discussed with him and his wife at bedside and  they're in agreement with same. He remains at very high risk for readmission considering his medication noncompliance and physician's advice.  CONSULTS OBTAINED:  Treatment Team:  [name], MD [name], MD  DRUG ALLERGIES:  No Known Allergies  DISCHARGE MEDICATIONS:   Current Discharge Medication List    START taking these medications   Details  diltiazem (CARDIZEM CD) 120 MG 24 hr capsule Take 1 capsule (120 mg total) by mouth daily. Qty: 30 capsule, Refills: 0    fluticasone (FLONASE) 50 MCG/ACT nasal spray Place 2 sprays into both nostrils 2 (two) times daily. Qty: 1 g, Refills: 0      CONTINUE these medications which have NOT CHANGED   Details  b complex-vitamin c-folic acid (NEPHRO-VITE) 0.8 MG TABS tablet Take 1 tablet by mouth daily.    calcium acetate (PHOSLO) 667 MG capsule Take 1,334 mg by mouth 3 (three) times daily with meals.    cloNIDine (CATAPRES) 0.3 MG tablet Take 0.3 mg by mouth 2 (two) times daily.    hydrALAZINE (APRESOLINE) 100 MG tablet Take 100 mg by mouth 3 (three) times daily.    labetalol (NORMODYNE) 200 MG tablet Take 600 mg by mouth 2 (two) times daily.    losartan (COZAAR) 100 MG tablet Take 100 mg by mouth daily.    minoxidil (LONITEN) 2.5 MG tablet Take 2.5 mg by mouth daily.    sildenafil (VIAGRA) 100 MG tablet Take 100 mg by mouth daily as needed for erectile dysfunction.         DISCHARGE INSTRUCTIONS:     DIET:  Regular diet and Cardiac diet  DISCHARGE CONDITION:  Good  ACTIVITY:  Activity as tolerated  OXYGEN:  Home Oxygen: No.   Oxygen Delivery: room air  DISCHARGE LOCATION:  home   If you experience worsening of your admission symptoms, develop shortness of breath, life threatening emergency, suicidal or homicidal thoughts you must seek medical attention immediately by calling 911 or calling your MD immediately  if symptoms less severe.  You Must read complete instructions/literature along with all  the possible adverse reactions/side effects for all the Medicines you take and that have been prescribed to you. Take any new Medicines after you have completely understood and accpet all the possible adverse reactions/side effects.   Please note  You were cared for by a hospitalist during your hospital stay. If you have any questions about your discharge medications or the care you received while you were in the hospital after you are discharged, you can call the unit and asked to speak with the hospitalist on call if the hospitalist that took care of you is not available. Once you are discharged, your primary care physician will handle any further medical issues. Please note that NO REFILLS for any discharge medications will be authorized once you are discharged, as it is imperative that you return to your primary care physician (or establish a relationship with a primary care physician if you do not have one) for your aftercare needs so that they can reassess your need for medications and monitor your lab values.     Today    VITAL SIGNS:  Blood pressure 131/60, pulse 77, temperature 98.5 F (36.9 C), temperature source Oral, resp. rate 18, height 6\' 1"  (1.854 m), weight 100.018 kg (220 lb 8 oz), SpO2 99 %.  I/O:   Intake/Output Summary (Last 24 hours) at 01/05/15 1033 Last data filed at 01/05/15 [id number]  Gross per 24 hour  Intake    240 ml  Output      0 ml  Net    240 ml    PHYSICAL EXAMINATION:  GENERAL:  49 y.o.-year-old patient lying in the bed with no acute distress.  EYES: Pupils equal, round, reactive to light and accommodation. No scleral icterus. Extraocular muscles intact.  HEENT: Head atraumatic, normocephalic. Oropharynx and nasopharynx clear.  NECK:  Supple, no jugular venous distention. No thyroid enlargement, no tenderness.  LUNGS: Normal breath sounds bilaterally, no wheezing, rales,rhonchi or crepitation. No use of accessory muscles of respiration.  CARDIOVASCULAR:  S1, S2 normal. No murmurs, rubs, or gallops.  ABDOMEN: Soft, non-tender, non-distended. Bowel sounds present. No organomegaly or mass.  EXTREMITIES: No pedal edema, cyanosis, or clubbing.  NEUROLOGIC: Cranial nerves II through XII are intact. Muscle strength 5/5 in all extremities. Sensation intact. Gait not checked.  PSYCHIATRIC: The patient is alert and oriented x 3.  SKIN: No obvious rash, lesion, or ulcer.   DATA REVIEW:   CBC  Recent Labs Lab 01/04/15 1016  WBC 5.4  HGB 9.9*  HCT 30.7*  PLT 187    Chemistries   Recent Labs Lab 01/04/15 0050 01/04/15 1016  NA 141  --   K 3.6  --   CL 98*  --   CO2 33*  --   GLUCOSE 89  --   BUN 24*  --   CREATININE 8.09* 9.09*  CALCIUM 9.6  --     Cardiac Enzymes  Recent Labs Lab 01/04/15 2211  TROPONINI 0.05*    Microbiology Results  No results found for this or any previous visit.  RADIOLOGY:  Dg Chest 2 View  01/04/2015   CLINICAL DATA:  Tachycardia, dyspnea and anterior chest pressure shortly after dialysis this morning.  EXAM: CHEST  2 VIEW  COMPARISON:  11/19/2011  FINDINGS: There is borderline cardiomegaly. The lungs are clear. The pulmonary vasculature is normal. There are no pleural effusions.  IMPRESSION: Borderline cardiomegaly, unchanged.  No acute findings.   Electronically Signed   By: Andreas Newport M.D.   On: 01/04/2015 01:04    Management plans discussed with the patient, family and they are in agreement.  CODE STATUS:     Code Status Orders        Start     Ordered   01/04/15 0853  Full code   Continuous     01/04/15 0852      TOTAL TIME TAKING CARE OF THIS PATIENT: 45 minutes.    [facility], Kleo Dungee M.D on 01/05/2015 at 10:33 AM  Between 7am to 6pm - Pager - [phone]  After 6pm go to [email or web address] - password [email or web address] [facility]  Office  [phone]  CC: Cardiology Dr. Annia Belt, MD [facility]

## 2015-01-05 NOTE — Progress Notes (Signed)
Patient requesting pain medication upon discharge. Paged Dr. Manuella Ghazi and MD stated patient will have to follow up with PCP for pain medication. Will notify patient.  Discharge instructions given. IV and tele discontinued. Diltiazem instructions given to patient along with prescriptions. Patient advised to schedule outpatient dialysis asap. Reviewed when to come back with chest pain once home.  Patient and wife have no further questions at this time.

## 2015-01-05 NOTE — Care Management CHF Note (Signed)
Order present for CM assessment for discharge planning.  Patient admitted with chest pain.  Assessed by cardiology and was recommended that patient be started on Coreg.  Patient refuses.  There is a significant amount of noncompliance with other aspects of his treatment plan.  He is a current hemodialysis client for ESRD.  Patient presents from home and independent in all adls.   Has health insurance.  Denies issues accessing medical care, obtaining medications, maintaining housing, utilities and food.   No discharge needs identified at present time.

## 2015-01-11 DIAGNOSIS — N186 End stage renal disease: Secondary | ICD-10-CM | POA: Insufficient documentation

## 2015-04-13 ENCOUNTER — Encounter: Payer: Self-pay | Admitting: Occupational Medicine

## 2015-04-13 ENCOUNTER — Emergency Department
Admission: EM | Admit: 2015-04-13 | Discharge: 2015-04-13 | Disposition: A | Discharge status: Home / Self Care | Payer: Medicare Other | Attending: Emergency Medicine | Admitting: Emergency Medicine

## 2015-04-13 DIAGNOSIS — Z79899 Other long term (current) drug therapy: Secondary | ICD-10-CM | POA: Insufficient documentation

## 2015-04-13 DIAGNOSIS — I120 Hypertensive chronic kidney disease with stage 5 chronic kidney disease or end stage renal disease: Secondary | ICD-10-CM | POA: Diagnosis not present

## 2015-04-13 DIAGNOSIS — N186 End stage renal disease: Secondary | ICD-10-CM | POA: Insufficient documentation

## 2015-04-13 DIAGNOSIS — Z720 Tobacco use: Secondary | ICD-10-CM | POA: Diagnosis not present

## 2015-04-13 DIAGNOSIS — Z792 Long term (current) use of antibiotics: Secondary | ICD-10-CM | POA: Insufficient documentation

## 2015-04-13 DIAGNOSIS — J029 Acute pharyngitis, unspecified: Secondary | ICD-10-CM | POA: Diagnosis not present

## 2015-04-13 DIAGNOSIS — Z992 Dependence on renal dialysis: Secondary | ICD-10-CM | POA: Diagnosis not present

## 2015-04-13 LAB — POCT RAPID STREP A: Streptococcus, Group A Screen (Direct): NEGATIVE

## 2015-04-13 MED ORDER — AZITHROMYCIN 250 MG PO TABS: 500.0000 mg | ORAL_TABLET | Freq: Every day | ORAL | Status: AC

## 2015-04-13 MED ORDER — AZITHROMYCIN 250 MG PO TABS
500.0000 mg | ORAL_TABLET | Freq: Once | ORAL | Status: AC
  Administered 2015-04-13: 500 mg via ORAL
  Filled 2015-04-13: qty 2

## 2015-04-13 MED ORDER — OXYCODONE-ACETAMINOPHEN 5-325 MG PO TABS
1.0000 | ORAL_TABLET | Freq: Once | ORAL | Status: AC
  Administered 2015-04-13: 1 via ORAL
  Filled 2015-04-13: qty 1

## 2015-04-13 NOTE — ED Notes (Addendum)
Pt presents with throat pain on left side for 1 week continue to get worse hurts to swallow or eat. Pt c/o of pain to move head. Pt states gland are swollen and hurts on the left side of neck. Pt is a diaylsis patient has to go a 6am.  Denies any fever. Ibuprofen about 28mins ago 800mg .

## 2015-04-13 NOTE — Discharge Instructions (Signed)

## 2015-04-13 NOTE — ED Provider Notes (Signed)
[facility] Emergency Department Provider Note  ____________________________________________  Time seen: 3:45 AM  I have reviewed the triage vital signs and the nursing notes.   HISTORY  Chief Complaint Sore Throat      HPI William WROBLE Sr. is a 49 y.o. male resents with sore throat times one week. Patient denies any fever describe pain at present as 5 out of 10.      Past Medical History  Diagnosis Date  . Dialysis patient   . Hypertension   . Chronic kidney disease     Patient Active Problem List   Diagnosis Date Noted  . Chest pain 01/04/2015  . HTN (hypertension) 01/04/2015  . ESRD on hemodialysis 01/04/2015    Past Surgical History  Procedure Laterality Date  . Av fistula placement      Current Outpatient Rx  Name  Route  Sig  Dispense  Refill  . azithromycin (ZITHROMAX) 250 MG tablet   Oral   Take 2 tablets (500 mg total) by mouth daily.   6 each   0   . b complex-vitamin c-folic acid (NEPHRO-VITE) 0.8 MG TABS tablet   Oral   Take 1 tablet by mouth daily.         . calcium acetate (PHOSLO) 667 MG capsule   Oral   Take 1,334 mg by mouth 3 (three) times daily with meals.         . cloNIDine (CATAPRES) 0.3 MG tablet   Oral   Take 0.3 mg by mouth 2 (two) times daily.         Marland Kitchen diltiazem (CARDIZEM CD) 120 MG 24 hr capsule   Oral   Take 1 capsule (120 mg total) by mouth daily.   30 capsule   0   . fluticasone (FLONASE) 50 MCG/ACT nasal spray   Each Nare   Place 2 sprays into both nostrils 2 (two) times daily.   1 g   0   . hydrALAZINE (APRESOLINE) 100 MG tablet   Oral   Take 100 mg by mouth 3 (three) times daily.         Marland Kitchen labetalol (NORMODYNE) 200 MG tablet   Oral   Take 600 mg by mouth 2 (two) times daily.         Marland Kitchen losartan (COZAAR) 100 MG tablet   Oral   Take 100 mg by mouth daily.         . minoxidil (LONITEN) 2.5 MG tablet   Oral   Take 2.5 mg by mouth daily.         . sildenafil  (VIAGRA) 100 MG tablet   Oral   Take 100 mg by mouth daily as needed for erectile dysfunction.           Allergies Review of patient's allergies indicates no known allergies.  History reviewed. No pertinent family history.  Social History Social History  Substance Use Topics  . Smoking status: Current Every Day Smoker -- 0.25 packs/day    Types: Cigarettes  . Smokeless tobacco: None  . Alcohol Use: No    Review of Systems  Constitutional: Negative for fever. Eyes: Negative for visual changes. ENT: Positive for sore throat. Cardiovascular: Negative for chest pain. Respiratory: Negative for shortness of breath. Gastrointestinal: Negative for abdominal pain, vomiting and diarrhea. Genitourinary: Negative for dysuria. Musculoskeletal: Negative for back pain. Skin: Negative for rash. Neurological: Negative for headaches, focal weakness or numbness.   10-point ROS otherwise negative.  ____________________________________________  PHYSICAL EXAM:  VITAL SIGNS: ED Triage Vitals  Enc Vitals Group     BP 04/13/15 0042 142/82 mmHg     Pulse Rate 04/13/15 0042 77     Resp 04/13/15 0042 18     Temp 04/13/15 0042 98.1 F (36.7 C)     Temp Source 04/13/15 0042 Oral     SpO2 04/13/15 0042 98 %     Weight 04/13/15 0042 217 lb 2.5 oz (98.5 kg)     Height 04/13/15 0042 6\' 2"  (1.88 m)     Head Cir --      [place] Flow --      Pain Score 04/13/15 0042 10     Pain Loc --      Pain Edu? --      Excl. in [place]? --     Constitutional: Alert and oriented. Well appearing and in no distress. Eyes: Conjunctivae are normal. PERRL. Normal extraocular movements. ENT   Head: Normocephalic and atraumatic.   Nose: No congestion/rhinnorhea.   Mouth/Throat: Mucous membranes are moist.   Neck: No stridor. Hematological/Lymphatic/Immunilogical: Positive left cervical lymphadenopathy. Cardiovascular: Normal rate, regular rhythm. Normal and symmetric distal pulses are present in  all extremities. No murmurs, rubs, or gallops. Respiratory: Normal respiratory effort without tachypnea nor retractions. Breath sounds are clear and equal bilaterally. No wheezes/rales/rhonchi. Gastrointestinal: Soft and nontender. No distention. There is no CVA tenderness. Genitourinary: deferred Musculoskeletal: Nontender with normal range of motion in all extremities. No joint effusions.  No lower extremity tenderness nor edema. Neurologic:  Normal speech and language. No gross focal neurologic deficits are appreciated. Speech is normal.  Skin:  Skin is warm, dry and intact. No rash noted. Psychiatric: Mood and affect are normal. Speech and behavior are normal. Patient exhibits appropriate insight and judgment.    RADIOLOGY    INITIAL IMPRESSION / ASSESSMENT AND PLAN / ED COURSE  Pertinent labs & imaging results that were available during my care of the patient were reviewed by me and considered in my medical decision making (see chart for details).    ____________________________________________   FINAL CLINICAL IMPRESSION(S) / ED DIAGNOSES  Final diagnoses:  Pharyngitis      Gregor Hams, MD 04/13/15 [phone]

## 2015-04-16 LAB — CULTURE, GROUP A STREP (THRC)

## 2016-06-18 IMAGING — US US EXTREM LOW VENOUS*L*
1 series · 13 of 24 positions shown · non-contrast
Comparison: None.

CLINICAL DATA: Atraumatic left leg pain.



[Series 1: us extrem low venous*left* · 0.07mm/px · 13 of 39 slices shown]
[im 1/39]
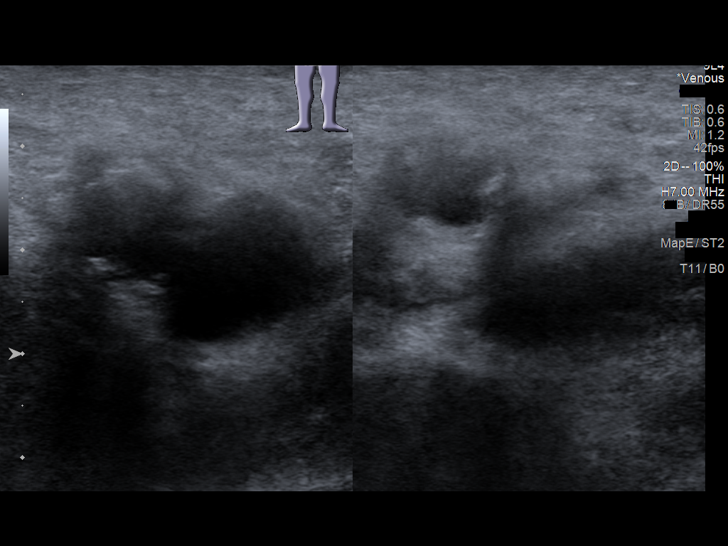
[im 4/39]
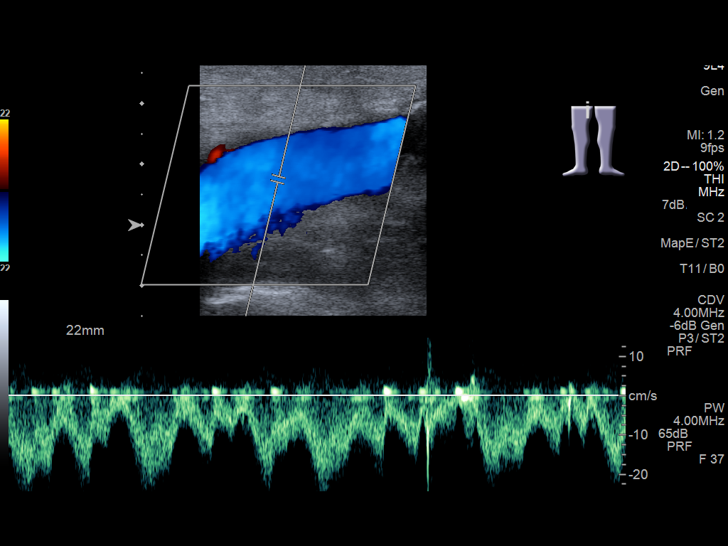
[im 7/39]
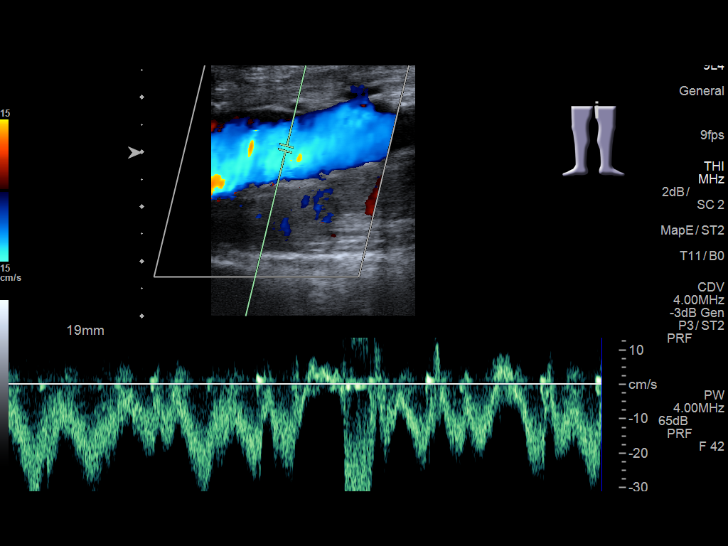
[im 10/39]
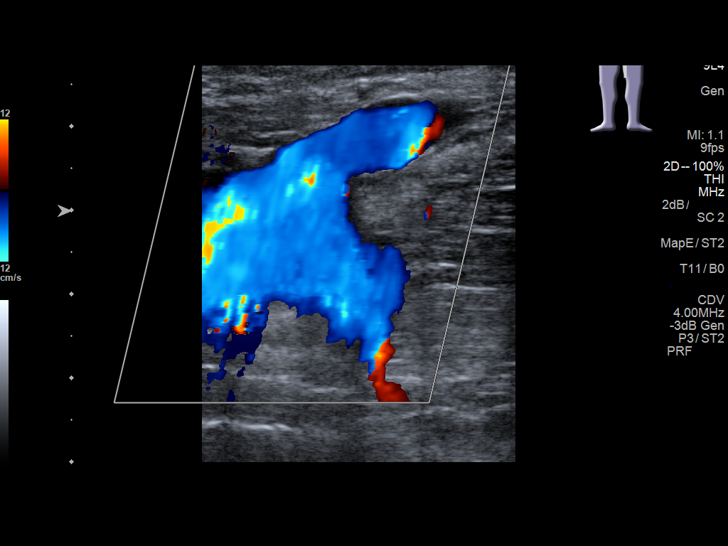
[im 14/39]
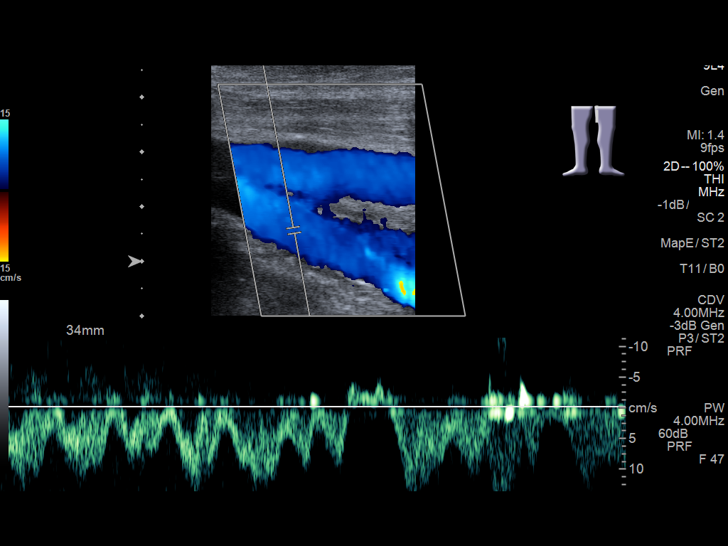
[im 17/39]
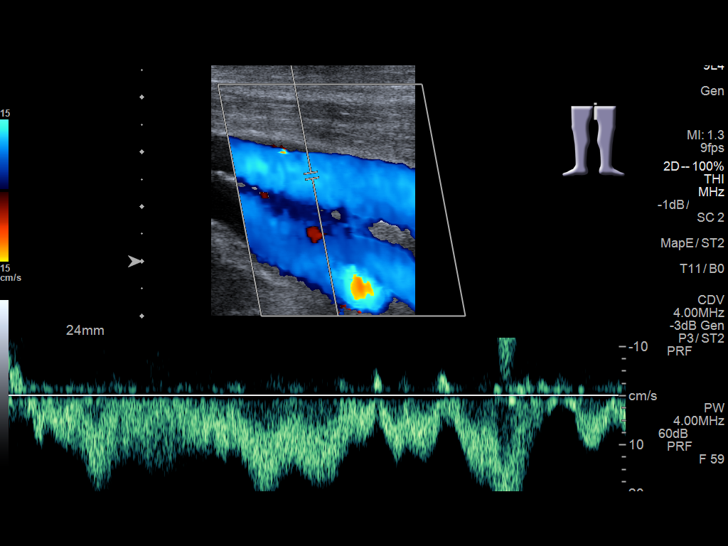
[im 20/39]
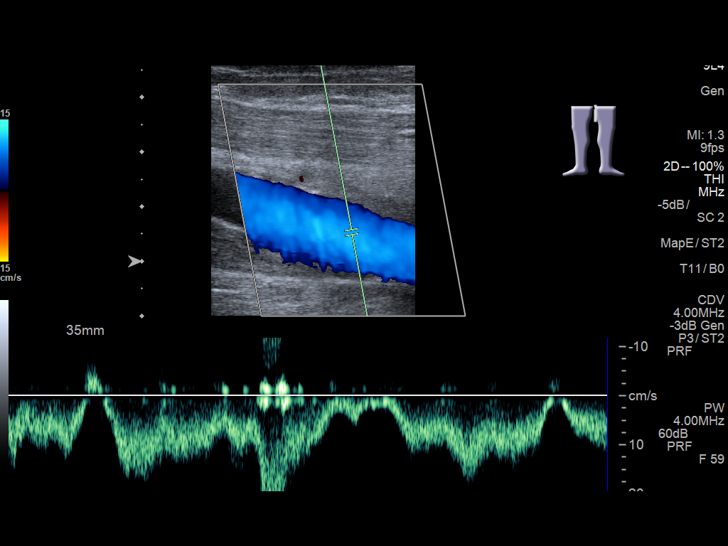
[im 22/39]
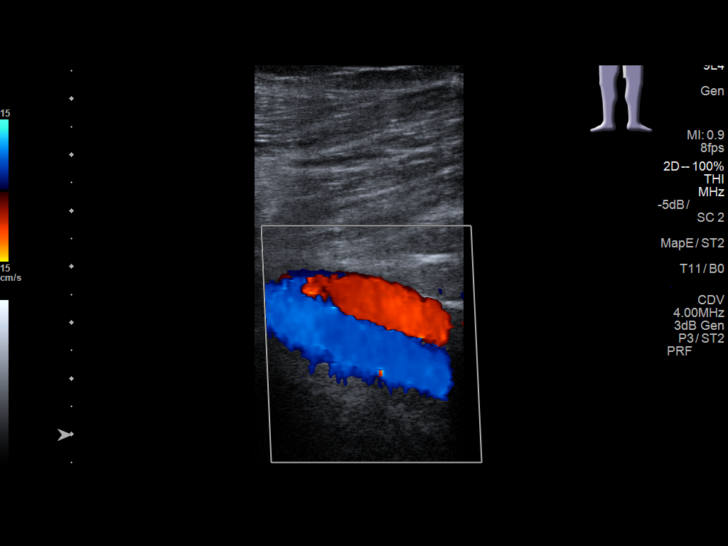
[im 25/39]
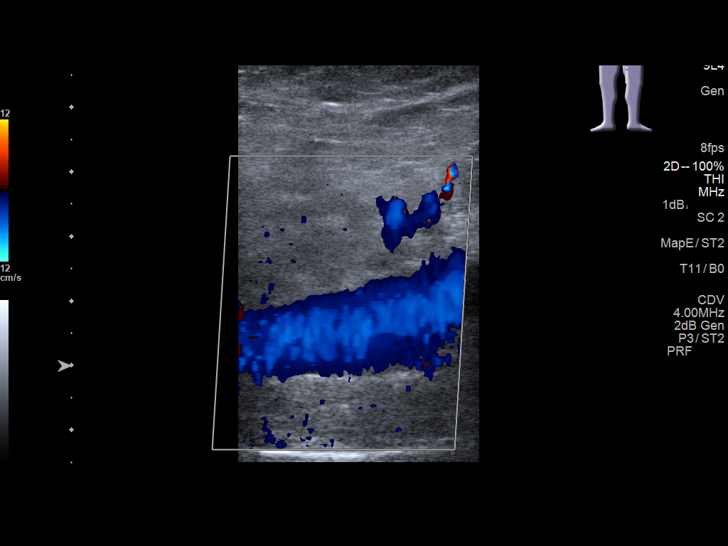
[im 29/39]
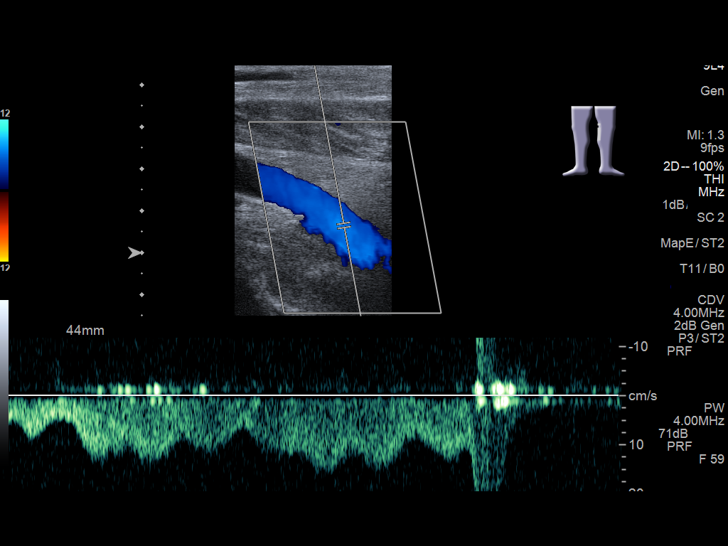
[im 32/39]
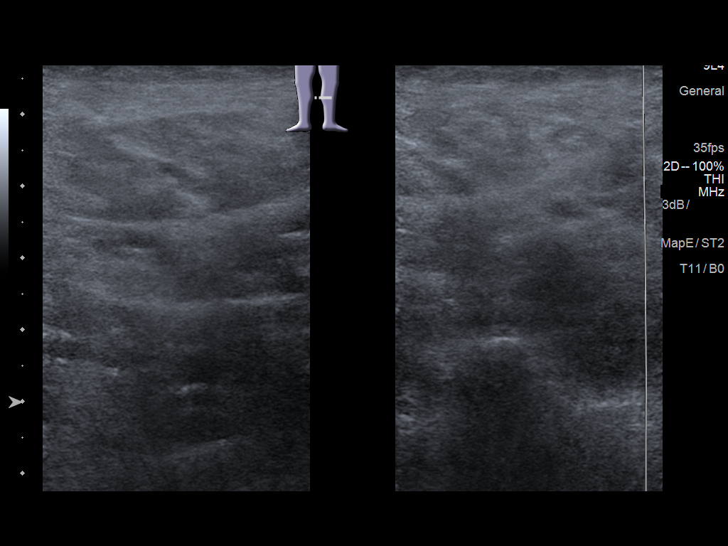
[im 35/39]
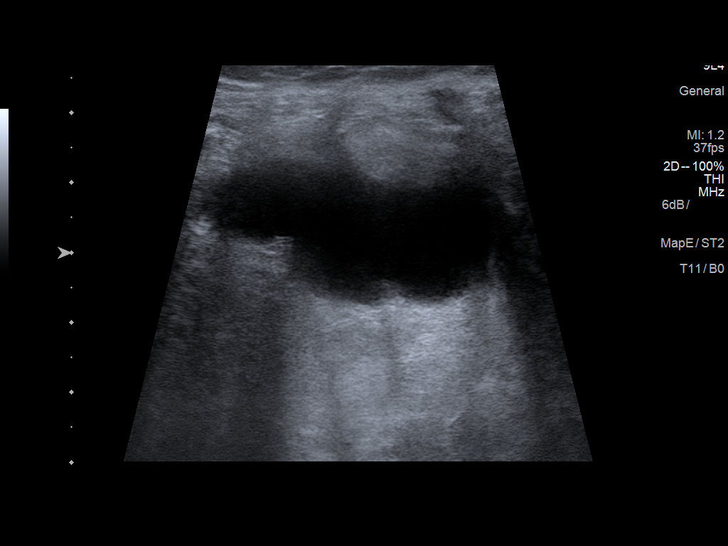
[im 39/39]
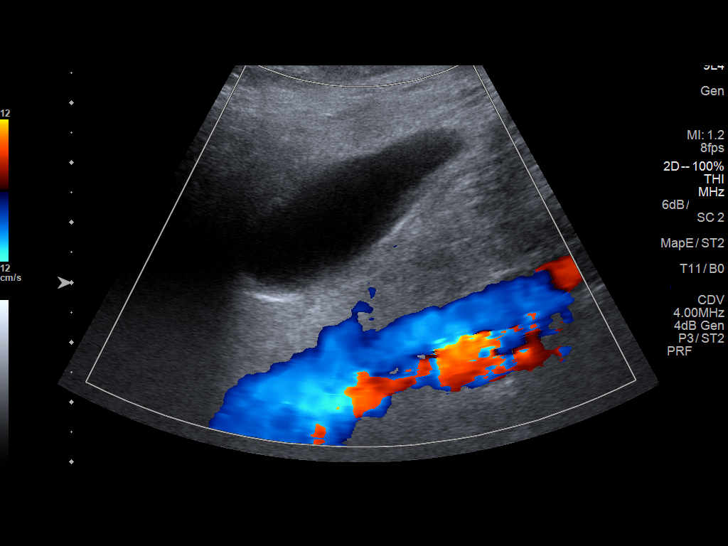

[13 of 24 positions shown; findings below may reference images not displayed]

FINDINGS: Contralateral Common Femoral Vein: Respiratory phasicity is normal
and symmetric with the symptomatic side. No evidence of thrombus.
Normal compressibility.

Common Femoral Vein: No evidence of thrombus. Normal
compressibility, respiratory phasicity and response to augmentation.

Saphenofemoral Junction: No evidence of thrombus. Normal
compressibility and flow on color Doppler imaging.

Profunda Femoral Vein: No evidence of thrombus. Normal
compressibility and flow on color Doppler imaging.

Femoral Vein: No evidence of thrombus. Normal compressibility,
respiratory phasicity and response to augmentation.

Popliteal Vein: No evidence of thrombus. Normal compressibility,
respiratory phasicity and response to augmentation.

Calf Veins: No evidence of thrombus. Normal compressibility and flow
on color Doppler imaging.

Superficial Great Saphenous Vein: No evidence of thrombus. Normal
compressibility.

Venous Reflux:  None.

Other Findings: Avascular fluid collection in the popliteal fossa
measuring 5 x 1.9 x 4.3 cm likely Baker cyst.
IMPRESSION: No evidence of left lower extremity deep venous thrombosis.

Baker's cyst in the popliteal fossa.

## 2016-11-12 ENCOUNTER — Ambulatory Visit (INDEPENDENT_AMBULATORY_CARE_PROVIDER_SITE_OTHER): Payer: Medicare Other | Admitting: Cardiology

## 2016-11-12 ENCOUNTER — Encounter: Payer: Self-pay | Admitting: Cardiology

## 2016-11-12 VITALS — BP 130/78 | HR 70 | Ht 73.0 in | Wt 216.5 lb

## 2016-11-12 DIAGNOSIS — R002 Palpitations: Secondary | ICD-10-CM | POA: Diagnosis not present

## 2016-11-12 DIAGNOSIS — I10 Essential (primary) hypertension: Secondary | ICD-10-CM | POA: Diagnosis not present

## 2016-11-12 NOTE — Progress Notes (Signed)
Cardiology Office Note   Date:  11/12/2016   ID:  William Pee Sr., DOB 24-Mar-1966, MRN [id number]  Referring Doctor:  No primary care provider on file.   Cardiologist:   Wende Bushy, MD   Reason for consultation:  Chief Complaint  Patient presents with  . other     Referral from Dr Elwyn Lade office/heart palpitations. Reviewed meds with pt verbally.      History of Present Illness: William LADOUCEUR Sr. is a 51 y.o. male who presents for Palpitations. Reports that this may have been going on for years now, more frequent recently. He feels a pounding sensation in the chest. Possibly an irregularity in his pulse. Mild to moderate in intensity, lasting a few hours mostly every day, randomly occurring, spontaneously resolving.  When he was on minoxidil, he experienced chest pain and shortness breath. He had a stress test done at [facility] and was told that it was normal. He has since discontinued that medication. He has had no recurrence of chest pain or shortness of breath.  Patient has no PND, orthopnea, edema. No passing out.   ROS:  Please see the history of present illness. Aside from mentioned under HPI, all other systems are reviewed and negative.     Past Medical History:  Diagnosis Date  . Chronic kidney disease   . Dialysis patient ([place])   . Hypertension     Past Surgical History:  Procedure Laterality Date  . APPENDECTOMY     age 51  . AV FISTULA PLACEMENT       reports that he has been smoking Cigarettes.  He has been smoking about 0.25 packs per day. He has never used smokeless tobacco. He reports that he uses drugs, including Marijuana. He reports that he does not drink alcohol.   family history includes Heart disease in his maternal grandmother and paternal grandmother; Heart failure in his father.   Outpatient Medications Prior to Visit  Medication Sig Dispense Refill  . b complex-vitamin c-folic acid (NEPHRO-VITE) 0.8 MG TABS tablet Take 1 tablet by mouth  daily.    . calcium acetate (PHOSLO) 667 MG capsule Take 1,334 mg by mouth 3 (three) times daily with meals.    . fluticasone (FLONASE) 50 MCG/ACT nasal spray Place 2 sprays into both nostrils 2 (two) times daily. 1 g 0  . hydrALAZINE (APRESOLINE) 100 MG tablet Take 100 mg by mouth 3 (three) times daily.    Marland Kitchen labetalol (NORMODYNE) 200 MG tablet Take 600 mg by mouth 2 (two) times daily.    . minoxidil (LONITEN) 2.5 MG tablet Take 2.5 mg by mouth daily.    . sildenafil (VIAGRA) 100 MG tablet Take 100 mg by mouth daily as needed for erectile dysfunction.    . cloNIDine (CATAPRES) 0.3 MG tablet Take 0.3 mg by mouth 2 (two) times daily.    Marland Kitchen diltiazem (CARDIZEM CD) 120 MG 24 hr capsule Take 1 capsule (120 mg total) by mouth daily. (Patient not taking: Reported on 11/12/2016) 30 capsule 0  . losartan (COZAAR) 100 MG tablet Take 100 mg by mouth daily.     No facility-administered medications prior to visit.      Allergies: Patient has no known allergies.    PHYSICAL EXAM: VS:  BP 130/78 (BP Location: Right Arm, Patient Position: Sitting, Cuff Size: Normal)   Pulse 70   Ht 6\' 1"  (1.854 m)   Wt 216 lb 8 oz (98.2 kg)   BMI 28.56 kg/m  ,  Body mass index is 28.56 kg/m. Wt Readings from Last 3 Encounters:  11/12/16 216 lb 8 oz (98.2 kg)  04/13/15 217 lb 2.5 oz (98.5 kg)  01/05/15 220 lb 8 oz (100 kg)    GENERAL:  well developed, well nourished, not in acute distress HEENT: normocephalic, pink conjunctivae, anicteric sclerae, no xanthelasma, normal dentition, oropharynx clear NECK:  no neck vein engorgement, JVP normal, no hepatojugular reflux, carotid upstroke brisk and symmetric, no bruit, no thyromegaly, no lymphadenopathy LUNGS:  good respiratory effort, clear to auscultation bilaterally CV:  PMI not displaced, no thrills, no lifts, S1 and S2 within normal limits, no palpable S3 or S4, no murmurs, no rubs, no gallops ABD:  Soft, nontender, nondistended, normoactive bowel sounds, no abdominal  aortic bruit, no hepatomegaly, no splenomegaly MS: nontender back, no kyphosis, no scoliosis, no joint deformities EXT:  2+ DP/PT pulses, no edema, no varicosities, no cyanosis, no clubbing SKIN: warm, nondiaphoretic, normal turgor, no ulcers. AVF on L wrist NEUROPSYCH: alert, oriented to person, place, and time, sensory/motor grossly intact, normal mood, appropriate affect  Recent Labs: No results found for requested labs within last 8760 hours.   Lipid Panel No results found for: CHOL, TRIG, HDL, CHOLHDL, VLDL, LDLCALC, LDLDIRECT   Other studies Reviewed:  EKG:  The ekg from 11/12/2016 was personally reviewed by me and it revealed sinus rhythm, 70 BPM. LVH. Abn EKG  Additional studies/ records that were reviewed personally reviewed by me today include: None available for review.   ASSESSMENT AND PLAN:  Palpitations Abn EKG Recommend echocardiogram and Holter monitor.  We will obtain the actual report of the exercise stress test that he had at [facility]. He has had no recurrence of chest pain or shortness of breath. We will be working at the palpitations.  HTN BP is well controlled. Continue monitoring BP. Continue current medical therapy and lifestyle changes. Being managed by nephrology.  Current medicines are reviewed at length with the patient today.  The patient does not have concerns regarding medicines.  Labs/ tests ordered today include:  Orders Placed This Encounter  Procedures  . Holter monitor - 24 hour  . EKG 12-Lead  . ECHOCARDIOGRAM COMPLETE    I had a lengthy and detailed discussion with the patient regarding diagnoses, prognosis, diagnostic options.  I counseled the patient on importance of lifestyle modification including heart healthy diet, regular physical activity .   Disposition:   FU with Cardiology after tests   Thank you for this consultation. We will forwarding this consultation to referring physician.   Signed, Wende Bushy, MD  11/12/2016  12:11 PM    William Hobbs  This note was generated in part with voice recognition software and I apologize for any typographical errors that were not detected and corrected.

## 2016-11-12 NOTE — Patient Instructions (Addendum)
Testing/Procedures: Your physician has requested that you have an echocardiogram. Echocardiography is a painless test that uses sound waves to create images of your heart. It provides your doctor with information about the size and shape of your heart and how well your heart's chambers and valves are working. This procedure takes approximately one hour. There are no restrictions for this procedure.  Your physician has recommended that you wear a holter monitor. Holter monitors are medical devices that record the heart's electrical activity. Doctors most often use these monitors to diagnose arrhythmias. Arrhythmias are problems with the speed or rhythm of the heartbeat. The monitor is a small, portable device. You can wear one while you do your normal daily activities. This is usually used to diagnose what is causing palpitations/syncope (passing out).    Follow-Up: Your physician recommends that you schedule a follow-up appointment as needed. We will call you with results and if needed schedule follow up at that time.   It was a pleasure seeing you today here in the office. Please do not hesitate to give [place] a call back if you have any further questions. [place], BSN    Echocardiogram An echocardiogram, or echocardiography, uses sound waves (ultrasound) to produce an image of your heart. The echocardiogram is simple, painless, obtained within a short period of time, and offers valuable information to your health care provider. The images from an echocardiogram can provide information such as:  Evidence of coronary artery disease (CAD).  Heart size.  Heart muscle function.  Heart valve function.  Aneurysm detection.  Evidence of a past heart attack.  Fluid buildup around the heart.  Heart muscle thickening.  Assess heart valve function. Tell a health care provider about:  Any allergies you have.  All medicines you are taking, including vitamins, herbs, eye  drops, creams, and over-the-counter medicines.  Any problems you or family members have had with anesthetic medicines.  Any blood disorders you have.  Any surgeries you have had.  Any medical conditions you have.  Whether you are pregnant or may be pregnant. What happens before the procedure? No special preparation is needed. Eat and drink normally. What happens during the procedure?  In order to produce an image of your heart, gel will be applied to your chest and a wand-like tool (transducer) will be moved over your chest. The gel will help transmit the sound waves from the transducer. The sound waves will harmlessly bounce off your heart to allow the heart images to be captured in real-time motion. These images will then be recorded.  You may need an IV to receive a medicine that improves the quality of the pictures. What happens after the procedure? You may return to your normal schedule including diet, activities, and medicines, unless your health care provider tells you otherwise. This information is not intended to replace advice given to you by your health care provider. Make sure you discuss any questions you have with your health care provider. Document Released: 08/08/2000 Document Revised: 03/29/2016 Document Reviewed: 04/18/2013 Elsevier Interactive Patient Education  2017 [place].  Holter Monitoring A Holter monitor is a small device that is used to detect abnormal heart rhythms. It clips to your clothing and is connected by wires to flat, sticky disks (electrodes) that attach to your chest. It is worn continuously for 24-48 hours. Follow these instructions at home:  Wear your Holter monitor at all times, even while exercising and sleeping, for as long as directed by your  health care provider.  Make sure that the Holter monitor is safely clipped to your clothing or close to your body as recommended by your health care provider.  Do not get the monitor or wires  wet.  Do not put body lotion or moisturizer on your chest.  Keep your skin clean.  Keep a diary of your daily activities, such as walking and doing chores. If you feel that your heartbeat is abnormal or that your heart is fluttering or skipping a beat:  Record what you are doing when it happens.  Record what time of day the symptoms occur.  Return your Holter monitor as directed by your health care provider.  Keep all follow-up visits as directed by your health care provider. This is important. Get help right away if:  You feel lightheaded or you faint.  You have trouble breathing.  You feel pain in your chest, upper arm, or jaw.  You feel sick to your stomach and your skin is pale, cool, or damp.  You heartbeat feels unusual or abnormal. This information is not intended to replace advice given to you by your health care provider. Make sure you discuss any questions you have with your health care provider. Document Released: 05/09/2004 Document Revised: 01/17/2016 Document Reviewed: 03/20/2014 Elsevier Interactive Patient Education  2017 [employer].

## 2016-11-14 ENCOUNTER — Other Ambulatory Visit: Payer: Self-pay | Admitting: *Deleted

## 2016-11-14 DIAGNOSIS — R002 Palpitations: Secondary | ICD-10-CM

## 2016-12-03 ENCOUNTER — Telehealth: Payer: Self-pay | Admitting: Cardiology

## 2016-12-03 NOTE — Telephone Encounter (Signed)
Spoke with patient to see if he would like to reschedule the monitor placement. He apologized and stated that he forgot the appointment and would like to come later today if possible. Patient states that he can come by after picking up his child from school. Let him know that would be fine and we would get him added on the schedule. He was appreciative for the call and had no further questions at this time.

## 2016-12-19 ENCOUNTER — Other Ambulatory Visit: Payer: Medicare Other

## 2016-12-19 ENCOUNTER — Encounter: Payer: Self-pay | Admitting: Cardiology

## 2017-07-21 ENCOUNTER — Encounter: Payer: Self-pay | Admitting: Emergency Medicine

## 2017-07-21 DIAGNOSIS — Z992 Dependence on renal dialysis: Secondary | ICD-10-CM | POA: Diagnosis not present

## 2017-07-21 DIAGNOSIS — N186 End stage renal disease: Secondary | ICD-10-CM | POA: Insufficient documentation

## 2017-07-21 DIAGNOSIS — F17210 Nicotine dependence, cigarettes, uncomplicated: Secondary | ICD-10-CM | POA: Diagnosis not present

## 2017-07-21 DIAGNOSIS — M79605 Pain in left leg: Secondary | ICD-10-CM | POA: Insufficient documentation

## 2017-07-21 DIAGNOSIS — I120 Hypertensive chronic kidney disease with stage 5 chronic kidney disease or end stage renal disease: Secondary | ICD-10-CM | POA: Insufficient documentation

## 2017-07-21 DIAGNOSIS — Z79899 Other long term (current) drug therapy: Secondary | ICD-10-CM | POA: Insufficient documentation

## 2017-07-21 DIAGNOSIS — M25552 Pain in left hip: Secondary | ICD-10-CM | POA: Diagnosis present

## 2017-07-21 NOTE — ED Triage Notes (Signed)
Pt reports he was sleeping on the floor at home when he woke up with left hip pain that radiates into the left knee. Pt describes the pain as an "ache." No obvious deformity nor swelling to the area.

## 2017-07-22 ENCOUNTER — Emergency Department
Admission: EM | Admit: 2017-07-22 | Discharge: 2017-07-22 | Disposition: A | Discharge status: Home / Self Care | Payer: Medicare Other | Attending: Emergency Medicine | Admitting: Emergency Medicine

## 2017-07-22 ENCOUNTER — Emergency Department: Payer: Medicare Other

## 2017-07-22 DIAGNOSIS — M79605 Pain in left leg: Secondary | ICD-10-CM | POA: Diagnosis not present

## 2017-07-22 IMAGING — CR DG HIP (WITH OR WITHOUT PELVIS) 2-3V*L*
1 series · 3 of 3 positions shown · non-contrast
Comparison: None.

CLINICAL DATA: Woke up with LEFT hip pain after sleeping on floor.

EXAM:
DG HIP (WITH OR WITHOUT PELVIS) 2-3V LEFT

[Series 1: dg hip unilat w or w/o pelvis 2-3 views  · non-contrast · 0.14mm/px · 3 of 3 slices shown]
[im 1/3]
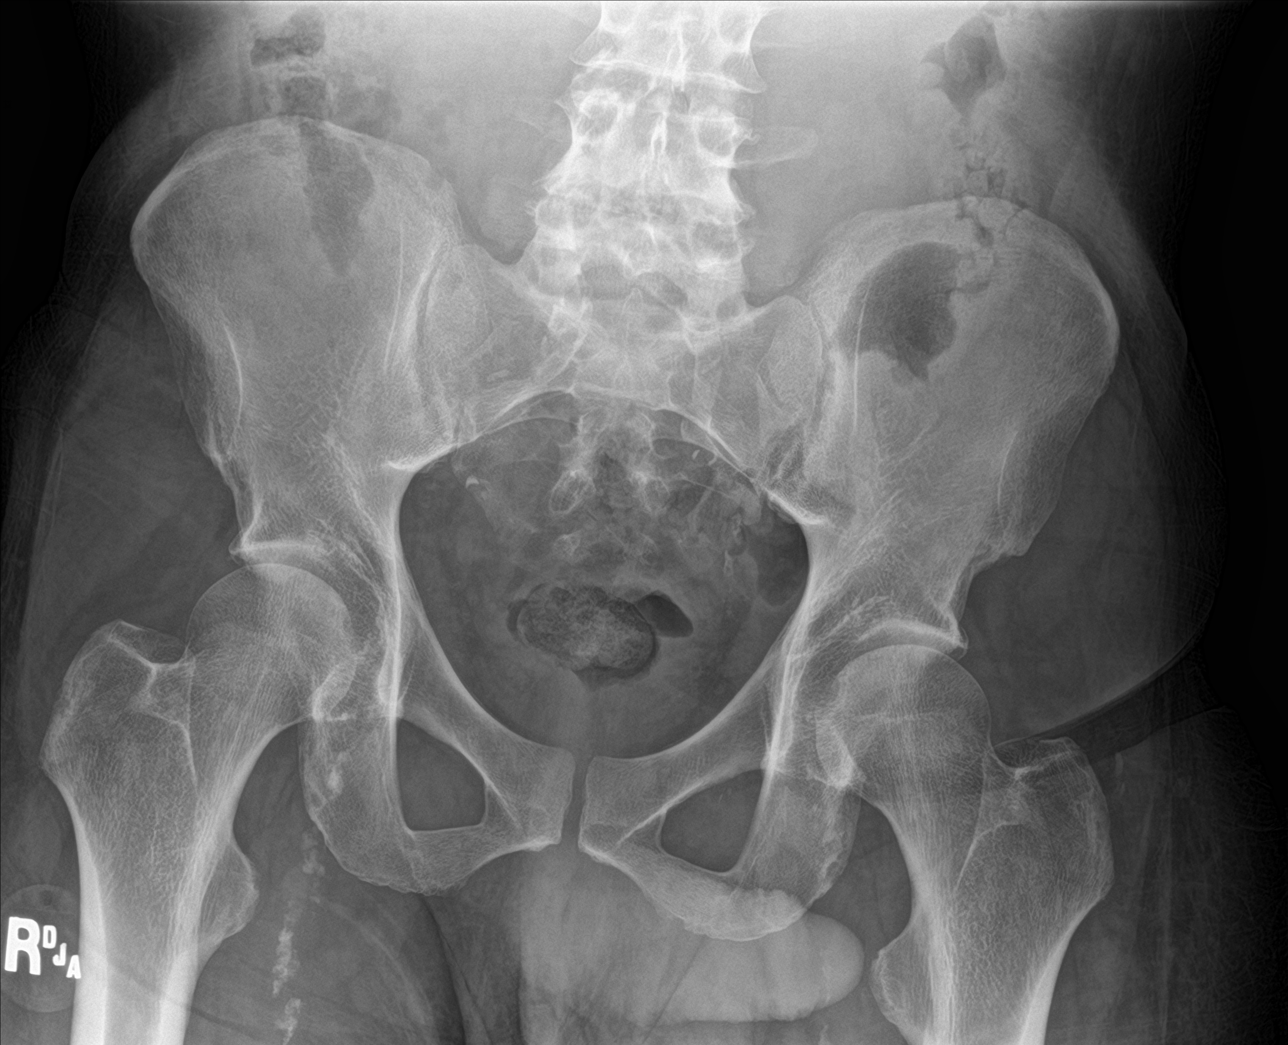
[im 2/3]
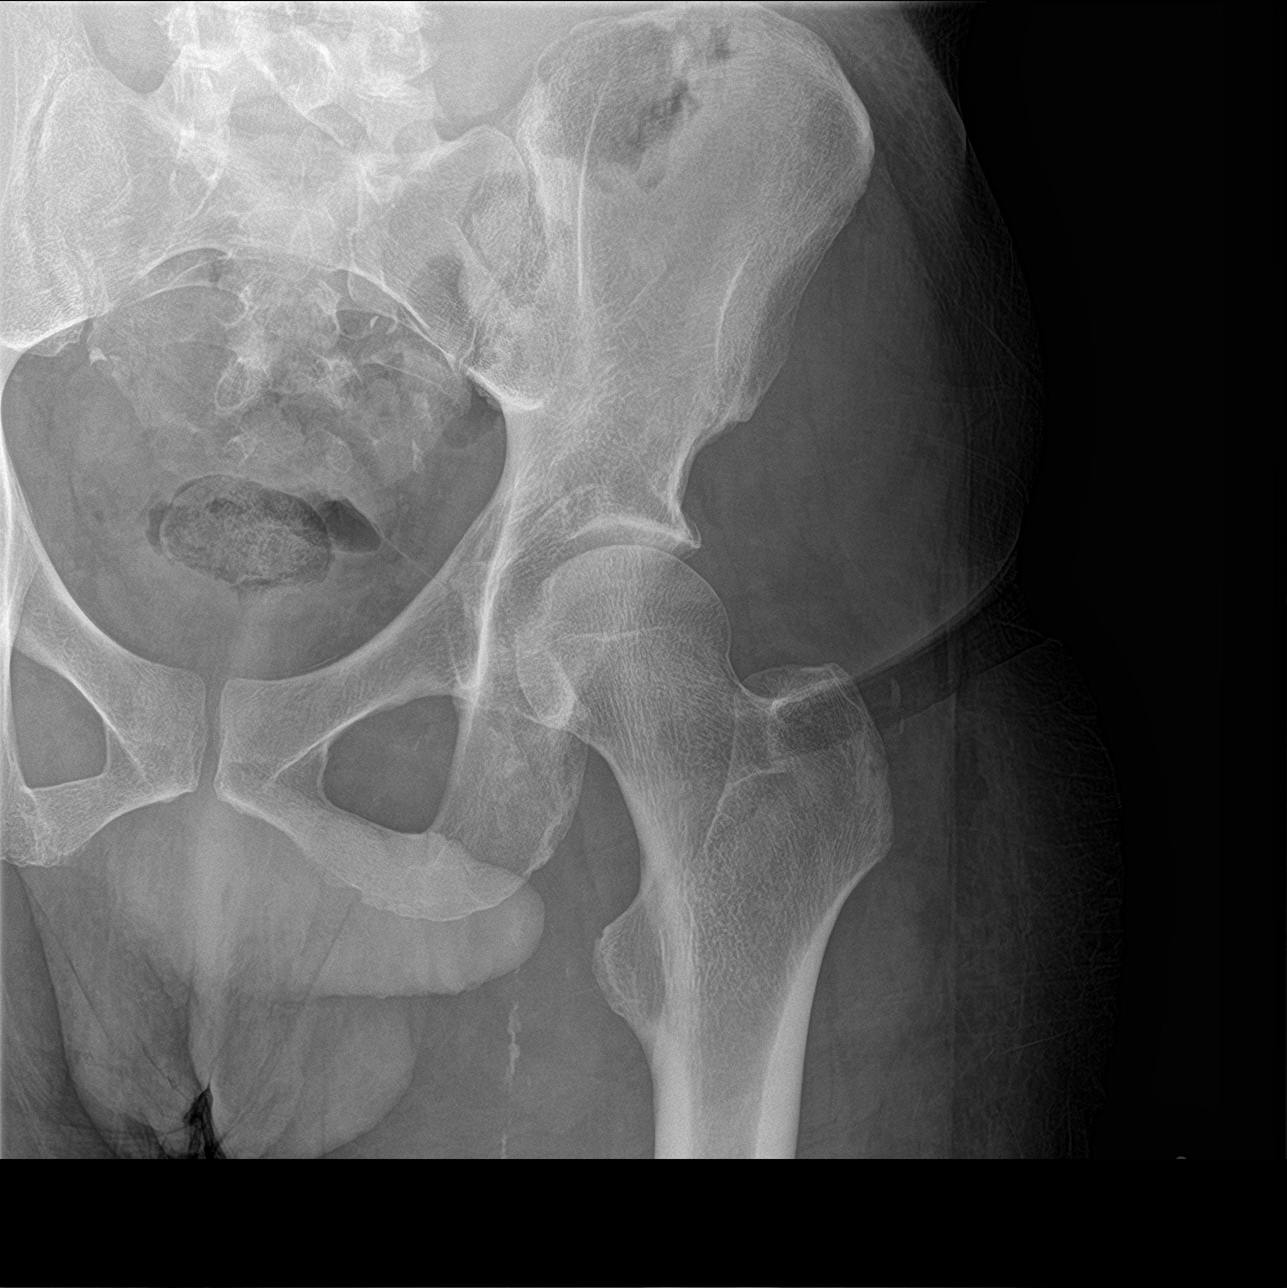
[im 3/3]
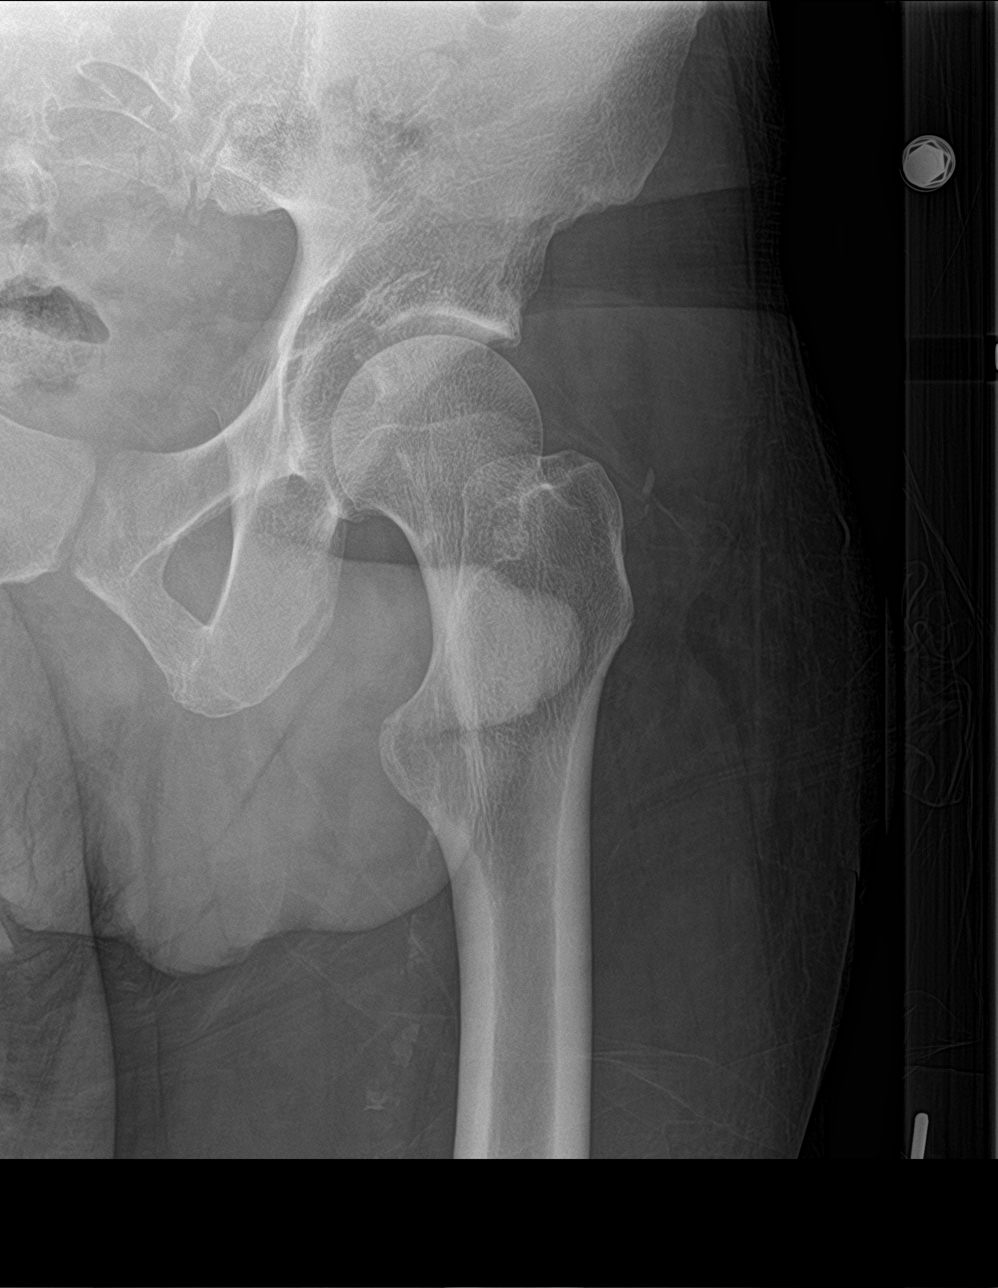

[3 of 3 positions shown; findings below may reference images not displayed]

FINDINGS: There is no evidence of hip fracture or dislocation. There is no
evidence of hip arthropathy or other focal bone abnormality.
Moderate aortoiliac calcifications. L4-5 degenerative disc.
IMPRESSION: 1. No acute osseous process.
2. Advanced atherosclerosis.
3. L4-5 spondylosis.

## 2017-07-22 MED ORDER — LIDOCAINE 5 % EX PTCH
1.0000 | MEDICATED_PATCH | CUTANEOUS | Status: DC
  Administered 2017-07-22: 1 via TRANSDERMAL
  Filled 2017-07-22: qty 1

## 2017-07-22 MED ORDER — TRAMADOL HCL 50 MG PO TABS: 50.0000 mg | ORAL_TABLET | Freq: Four times a day (QID) | ORAL | 0 refills | Status: AC | PRN

## 2017-07-22 MED ORDER — OXYCODONE-ACETAMINOPHEN 5-325 MG PO TABS
1.0000 | ORAL_TABLET | Freq: Once | ORAL | Status: AC
  Administered 2017-07-22: 1 via ORAL
  Filled 2017-07-22: qty 1

## 2017-07-22 MED ORDER — OXYCODONE-ACETAMINOPHEN 5-325 MG PO TABS
1.0000 | ORAL_TABLET | Freq: Once | ORAL | Status: AC
Start: 2017-07-22 — End: 2017-07-22
  Administered 2017-07-22: 1 via ORAL
  Filled 2017-07-22: qty 1

## 2017-07-22 MED ORDER — LIDOCAINE 5 % EX PTCH: 1.0000 | MEDICATED_PATCH | CUTANEOUS | 0 refills | Status: AC

## 2017-07-22 NOTE — ED Provider Notes (Signed)
[facility] Provider Note   First MD Initiated Contact with Patient 07/22/17 0112     (approximate)  I have reviewed the triage vital signs and the nursing notes.   HISTORY  Chief Complaint Leg Pain    HPI William PUIG Sr. is a 51 y.o. male with below list of chronic medical conditions presents to the emergency department with nontraumatic left hip pain that radiates down the leg.  Patient states current pain score is 8 out of 10.  Patient states pain is worse with any palpation.  Patient denies any lower externally swelling.  Patient denies any dyspnea or chest pain.   Past Medical History:  Diagnosis Date  . Chronic kidney disease   . Dialysis patient ([place])   . Hypertension     Patient Active Problem List   Diagnosis Date Noted  . Chest pain 01/04/2015  . HTN (hypertension) 01/04/2015  . ESRD on hemodialysis ([place]) 01/04/2015    Past Surgical History:  Procedure Laterality Date  . APPENDECTOMY     age 72  . AV FISTULA PLACEMENT      Prior to Admission medications   Medication Sig Start Date End Date Taking? Authorizing Provider  b complex-vitamin c-folic acid (NEPHRO-VITE) 0.8 MG TABS tablet Take 1 tablet by mouth daily.    [provider]  calcium acetate (PHOSLO) 667 MG capsule Take 1,334 mg by mouth 3 (three) times daily with meals.    [provider]  fluticasone (FLONASE) 50 MCG/ACT nasal spray Place 2 sprays into both nostrils 2 (two) times daily. 01/05/15   [name], MD  hydrALAZINE (APRESOLINE) 100 MG tablet Take 100 mg by mouth 3 (three) times daily.    [provider]  labetalol (NORMODYNE) 200 MG tablet Take 600 mg by mouth 2 (two) times daily.    [provider]  lidocaine (LIDODERM) 5 % Place 1 patch onto the skin daily. Remove & Discard patch within 12 hours or as directed by MD 07/22/17   Gregor Hams, MD  minoxidil (LONITEN) 2.5 MG tablet Take 2.5 mg by mouth  daily.    [provider]  nitroGLYCERIN (NITROSTAT) 0.3 MG SL tablet Place 0.3 mg under the tongue. 04/09/16 04/09/17  [provider]  omeprazole (PRILOSEC) 20 MG capsule Take 20 mg by mouth as needed. 09/26/10   [provider]  ranitidine (ZANTAC) 150 MG tablet Take 150 mg by mouth as needed. 08/02/10   [provider]  sildenafil (VIAGRA) 100 MG tablet Take 100 mg by mouth daily as needed for erectile dysfunction.    [provider]  traMADol (ULTRAM) 50 MG tablet Take 1 tablet (50 mg total) by mouth every 6 (six) hours as needed. 07/22/17 07/22/18  Gregor Hams, MD    Allergies No known drug allergies  Family History  Problem Relation Age of Onset  . Heart failure Father   . Heart disease Maternal Grandmother   . Heart disease Paternal Grandmother     Social History Social History   Tobacco Use  . Smoking status: Current Every Day Smoker    Packs/day: 0.25    Types: Cigarettes  . Smokeless tobacco: Never Used  . Tobacco comment: 1 pack every 3 days.  Substance Use Topics  . Alcohol use: No  . Drug use: Yes    Types: Marijuana    Review of Systems Constitutional: No fever/chills Eyes: No visual changes. ENT: No sore throat. Cardiovascular: Denies chest pain.  Respiratory: Denies shortness of breath. Gastrointestinal: No abdominal pain.  No nausea, no vomiting.  No diarrhea.  No constipation. Genitourinary: Negative for dysuria. Musculoskeletal: Negative for neck pain.  Negative for back pain.  Positive for left hip pain Integumentary: Negative for rash. Neurological: Negative for headaches, focal weakness or numbness.  ____________________________________________   PHYSICAL EXAM:  VITAL SIGNS: ED Triage Vitals  Enc Vitals Group     BP 07/21/17 2351 140/68     Pulse Rate 07/21/17 2351 70     Resp --      Temp 07/21/17 2351 98.9 F (37.2 C)     Temp Source 07/21/17 2351 Oral     SpO2 07/21/17 2351 98 %      Weight 07/21/17 2348 98 kg (216 lb)     Height --      Head Circumference --      Peak Flow --      Pain Score --      Pain Loc --      Pain Edu? --      Excl. in [place]? --     Constitutional: Alert and oriented. Well appearing and in no acute distress. Eyes: Conjunctivae are normal. Head: Atraumatic. Mouth/Throat: Mucous membranes are moist. Oropharynx non-erythematous. Neck: No stridor.  Cardiovascular: Normal rate, regular rhythm. Good peripheral circulation. Grossly normal heart sounds. Respiratory: Normal respiratory effort.  No retractions. Lungs CTAB. Gastrointestinal: Soft and nontender. No distention.  Musculoskeletal: Pain with very gentle palpation left lateral upper thigh/hip no gross deformities of extremities. Neurologic:  Normal speech and language. No gross focal neurologic deficits are appreciated.  Skin:  Skin is warm, dry and intact. No rash noted. Psychiatric: Mood and affect are normal. Speech and behavior are normal.    RADIOLOGY I, [place], personally viewed and evaluated these images (plain radiographs) as part of my medical decision making, as well as reviewing the written report by the radiologist. US Venous Img Lower Unilateral Left  Result Date: 07/22/2017 CLINICAL DATA:  Atraumatic left leg pain. EXAM: LEFT LOWER EXTREMITY VENOUS DOPPLER ULTRASOUND TECHNIQUE: Gray-scale sonography with graded compression, as well as color Doppler and duplex ultrasound were performed to evaluate the lower extremity deep venous systems from the level of the common femoral vein and including the common femoral, femoral, profunda femoral, popliteal and calf veins including the posterior tibial, peroneal and gastrocnemius veins when visible. The superficial great saphenous vein was also interrogated. Spectral Doppler was utilized to evaluate flow at rest and with distal augmentation maneuvers in the common femoral, femoral and popliteal veins. COMPARISON:  None. FINDINGS:  Contralateral Common Femoral Vein: Respiratory phasicity is normal and symmetric with the symptomatic side. No evidence of thrombus. Normal compressibility. Common Femoral Vein: No evidence of thrombus. Normal compressibility, respiratory phasicity and response to augmentation. Saphenofemoral Junction: No evidence of thrombus. Normal compressibility and flow on color Doppler imaging. Profunda Femoral Vein: No evidence of thrombus. Normal compressibility and flow on color Doppler imaging. Femoral Vein: No evidence of thrombus. Normal compressibility, respiratory phasicity and response to augmentation. Popliteal Vein: No evidence of thrombus. Normal compressibility, respiratory phasicity and response to augmentation. Calf Veins: No evidence of thrombus. Normal compressibility and flow on color Doppler imaging. Superficial Great Saphenous Vein: No evidence of thrombus. Normal compressibility. Venous Reflux:  None. Other Findings: Avascular fluid collection in the popliteal fossa measuring 5 x 1.9 x 4.3 cm likely Baker cyst. IMPRESSION: No evidence of left lower extremity deep venous thrombosis. Baker's cyst in the popliteal fossa.  Electronically Signed   By: Jeb Levering M.D.   On: 07/22/2017 03:39   Dg Hip Unilat W Or Wo Pelvis 2-3 Views Left  Result Date: 07/22/2017 CLINICAL DATA:  Woke up with LEFT hip pain after sleeping on floor. EXAM: DG HIP (WITH OR WITHOUT PELVIS) 2-3V LEFT COMPARISON:  None. FINDINGS: There is no evidence of hip fracture or dislocation. There is no evidence of hip arthropathy or other focal bone abnormality. Moderate aortoiliac calcifications. L4-5 degenerative disc. IMPRESSION: 1. No acute osseous process. 2. Advanced atherosclerosis. 3. L4-5 spondylosis. Electronically Signed   By: Elon Alas M.D.   On: 07/22/2017 02:10     Procedures   ____________________________________________   INITIAL IMPRESSION / ASSESSMENT AND PLAN / ED COURSE  As part of my medical  decision making, I reviewed the following data within the electronic MEDICAL RECORD [id number] year old male presented with above-stated history and physical exam of nontraumatic left lateral upper thigh/hip pain.  X-ray revealed no acute abnormality of the area of concern ultrasound revealed no evidence of DVT or PE.  Suspect muscular etiology for the patient's pain.  Lidoderm patch applied patient given Percocet in the emergency department with pain improved ____________________________________________  FINAL CLINICAL IMPRESSION(S) / ED DIAGNOSES  Final diagnoses:  Left leg pain     MEDICATIONS GIVEN DURING THIS VISIT:  Medications  lidocaine (LIDODERM) 5 % 1 patch (1 patch Transdermal Patch Applied 07/22/17 0141)  oxyCODONE-acetaminophen (PERCOCET/ROXICET) 5-325 MG per tablet 1 tablet (1 tablet Oral Given 07/22/17 0140)  oxyCODONE-acetaminophen (PERCOCET/ROXICET) 5-325 MG per tablet 1 tablet (1 tablet Oral Given 07/22/17 0523)     ED Discharge Orders        Ordered    lidocaine (LIDODERM) 5 %  Every 24 hours     07/22/17 0448    traMADol (ULTRAM) 50 MG tablet  Every 6 hours PRN     07/22/17 0448       Note:  This document was prepared using Dragon voice recognition software and may include unintentional dictation errors.    Gregor Hams, MD 07/22/17 [phone]

## 2017-07-22 NOTE — ED Notes (Signed)
Pt to xray via stretcher accomp by radiology tech 

## 2017-07-22 NOTE — ED Notes (Addendum)
Pt lying supine on stretcher in exam room with no distress noted; pt reports on Thanksgiving noted aching pain to left hip but went away; then tonight began having recurrent pain that increases with weight bearing; denies any known injury; denies pain to calf or with dorsiflexion of foot; st pain increases with palpation of upper lateral left thigh; no swelling or deformity noted; pt st pain begins at top of buttock and radiates into hip; +PP, brisk cap refill, W&D, good movem/sensation; pt reports dialysis completed today and has not missed any

## 2017-07-22 NOTE — ED Notes (Signed)
Pt to u/s via stretcher accomp by u/s tech 

## 2018-01-16 ENCOUNTER — Other Ambulatory Visit
Admission: RE | Admit: 2018-01-16 | Discharge: 2018-01-16 | Disposition: A | Discharge status: Home / Self Care | Payer: Medicare Other | Source: Ambulatory Visit | Attending: Nephrology | Admitting: Nephrology

## 2018-01-16 DIAGNOSIS — N186 End stage renal disease: Secondary | ICD-10-CM | POA: Insufficient documentation

## 2018-01-17 LAB — HEPATITIS B E ANTIGEN: Hep B E Ag: POSITIVE — AB

## 2018-01-17 LAB — HEPATITIS B SURFACE ANTIGEN: Hepatitis B Surface Ag: NEGATIVE

## 2019-11-04 ENCOUNTER — Ambulatory Visit
Admission: EM | Admit: 2019-11-04 | Discharge: 2019-11-04 | Disposition: A | Discharge status: Home / Self Care | Payer: Medicare Other | Attending: Family Medicine | Admitting: Family Medicine

## 2019-11-04 ENCOUNTER — Other Ambulatory Visit: Payer: Self-pay

## 2019-11-04 ENCOUNTER — Encounter: Payer: Self-pay | Admitting: Emergency Medicine

## 2019-11-04 DIAGNOSIS — M25512 Pain in left shoulder: Secondary | ICD-10-CM

## 2019-11-04 DIAGNOSIS — G8929 Other chronic pain: Secondary | ICD-10-CM

## 2019-11-04 DIAGNOSIS — M25511 Pain in right shoulder: Secondary | ICD-10-CM

## 2019-11-04 MED ORDER — PREDNISONE 20 MG PO TABS: ORAL_TABLET | ORAL | 0 refills | Status: AC

## 2019-11-04 NOTE — ED Triage Notes (Signed)
Patient c/o bilateral shoulder pain and upper back pain that started a year ago.  Patient states that his pain has gotten worse over the past 3 days.  Patient denies fall or injury.  Patient has been taking Tylenol for pain at home.

## 2019-11-04 NOTE — Discharge Instructions (Signed)
Continue tylenol; heat to area

## 2019-11-05 NOTE — ED Provider Notes (Addendum)
[facility]    CSN: [id number] Arrival date & time: 11/04/19  1046      History   Chief Complaint Chief Complaint  Patient presents with  . Back Pain  . Shoulder Pain    HPI William CLEVENGER Sr. is a 54 y.o. male.   54 yo male with a intermittent bilateral shoulder pains for the past year. Denies any falls or other injuries, swelling, redness, fevers, chills, numbness/tingling. Takes tylenol for pain. Patient has chronic kidney disease and is on dialysis.    Back Pain Shoulder Pain Associated symptoms: back pain     Past Medical History:  Diagnosis Date  . Chronic kidney disease   . Dialysis patient ([place])   . Hypertension     Patient Active Problem List   Diagnosis Date Noted  . Chest pain 01/04/2015  . HTN (hypertension) 01/04/2015  . ESRD on hemodialysis ([place]) 01/04/2015    Past Surgical History:  Procedure Laterality Date  . APPENDECTOMY     age 36  . AV FISTULA PLACEMENT         Home Medications    Prior to Admission medications   Medication Sig Start Date End Date Taking? Authorizing Provider  fluticasone (FLONASE) 50 MCG/ACT nasal spray Place 2 sprays into both nostrils 2 (two) times daily. 01/05/15  Yes [name], MD  hydrALAZINE (APRESOLINE) 100 MG tablet Take 100 mg by mouth 3 (three) times daily.   Yes [provider]  labetalol (NORMODYNE) 200 MG tablet Take 600 mg by mouth 2 (two) times daily.   Yes [provider]  minoxidil (LONITEN) 2.5 MG tablet Take 2.5 mg by mouth daily.   Yes [provider]  omeprazole (PRILOSEC) 20 MG capsule Take 20 mg by mouth as needed. 09/26/10  Yes [provider]  b complex-vitamin c-folic acid (NEPHRO-VITE) 0.8 MG TABS tablet Take 1 tablet by mouth daily.    [provider]  calcium acetate (PHOSLO) 667 MG capsule Take 1,334 mg by mouth 3 (three) times daily with meals.    [provider]  lidocaine (LIDODERM) 5 % Place 1 patch onto the skin daily.  Remove & Discard patch within 12 hours or as directed by MD 07/22/17   Gregor Hams, MD  nitroGLYCERIN (NITROSTAT) 0.3 MG SL tablet Place 0.3 mg under the tongue. 04/09/16 04/09/17  [provider]  predniSONE (DELTASONE) 20 MG tablet 3 tabs po qd x 2 days, then 2 tabs po qd x 3 days, then 1 tab po qd x 3 days, then half a tab po qd x 2 days 11/04/19   Norval Gable, MD  ranitidine (ZANTAC) 150 MG tablet Take 150 mg by mouth as needed. 08/02/10   [provider]  sildenafil (VIAGRA) 100 MG tablet Take 100 mg by mouth daily as needed for erectile dysfunction.    [provider]    Family History Family History  Problem Relation Age of Onset  . Heart failure Father   . Heart disease Maternal Grandmother   . Heart disease Paternal Grandmother     Social History Social History   Tobacco Use  . Smoking status: Current Every Day Smoker    Packs/day: 0.25    Types: Cigarettes  . Smokeless tobacco: Never Used  . Tobacco comment: 1 pack every 3 days.  Substance Use Topics  . Alcohol use: No  . Drug use: Yes    Types: Marijuana     Allergies   Patient has no  known allergies.   Review of Systems Review of Systems  Musculoskeletal: Positive for back pain.     Physical Exam Triage Vital Signs ED Triage Vitals  Enc Vitals Group     BP 11/04/19 1118 129/73     Pulse Rate 11/04/19 1118 72     Resp 11/04/19 1118 16     Temp 11/04/19 1118 97.8 F (36.6 C)     Temp Source 11/04/19 1118 Oral     SpO2 11/04/19 1118 99 %     Weight 11/04/19 1115 198 lb 6.6 oz (90 kg)     Height 11/04/19 1115 6\' 1"  (1.854 m)     Head Circumference --      Peak Flow --      Pain Score 11/04/19 1114 8     Pain Loc --      Pain Edu? --      Excl. in [place]? --    No data found.  Updated Vital Signs BP 129/73 (BP Location: Right Arm)   Pulse 72   Temp 97.8 F (36.6 C) (Oral)   Resp 16   Ht 6\' 1"  (1.854 m)   Wt 90 kg   SpO2 99%   BMI 26.18 kg/m   Visual  Acuity Right Eye Distance:   Left Eye Distance:   Bilateral Distance:    Right Eye Near:   Left Eye Near:    Bilateral Near:     Physical Exam Vitals and nursing note reviewed.  Constitutional:      General: He is not in acute distress.    Appearance: He is not toxic-appearing or diaphoretic.  Musculoskeletal:     Right shoulder: Tenderness (generalized; diffuse) present. No swelling, deformity, effusion, laceration, bony tenderness or crepitus. Normal range of motion. Normal strength. Normal pulse.     Left shoulder: Tenderness (generalized) present. No swelling, deformity, effusion, laceration, bony tenderness or crepitus. Normal range of motion. Normal strength. Normal pulse.     Comments: Upper extremities neurovascularly intact  Neurological:     Mental Status: He is alert.      UC Treatments / Results  Labs (all labs ordered are listed, but only abnormal results are displayed) Labs Reviewed - No data to display  EKG   Radiology No results found.  Procedures Procedures (including critical care time)  Medications Ordered in UC Medications - No data to display  Initial Impression / Assessment and Plan / UC Course  I have reviewed the triage vital signs and the nursing notes.  Pertinent labs & imaging results that were available during my care of the patient were reviewed by me and considered in my medical decision making (see chart for details).      Final Clinical Impressions(s) / UC Diagnoses   Final diagnoses:  Chronic pain of both shoulders     Discharge Instructions     Continue tylenol; heat to area    ED Prescriptions    Medication Sig Dispense Auth. Provider   predniSONE (DELTASONE) 20 MG tablet 3 tabs po qd x 2 days, then 2 tabs po qd x 3 days, then 1 tab po qd x 3 days, then half a tab po qd x 2 days 16 tablet Mykel Mohl, MD      1. diagnosis reviewed with patient 2. rx as per orders above; reviewed possible side effects,  interactions, risks and benefits  3. Recommend supportive treatment as above 4. Follow-up prn if symptoms worsen or don't improve  PDMP not reviewed  this encounter.   Norval Gable, MD 11/05/19 7289    Norval Gable, MD 11/05/19 1220

## 2021-03-03 ENCOUNTER — Other Ambulatory Visit: Payer: Self-pay

## 2021-03-03 ENCOUNTER — Encounter: Payer: Self-pay | Admitting: Emergency Medicine

## 2021-03-03 ENCOUNTER — Emergency Department
Admission: EM | Admit: 2021-03-03 | Discharge: 2021-03-03 | Disposition: A | Discharge status: Home / Self Care | Payer: Medicare Other | Attending: Emergency Medicine | Admitting: Emergency Medicine

## 2021-03-03 ENCOUNTER — Emergency Department: Payer: Medicare Other

## 2021-03-03 DIAGNOSIS — N186 End stage renal disease: Secondary | ICD-10-CM | POA: Diagnosis not present

## 2021-03-03 DIAGNOSIS — Z79899 Other long term (current) drug therapy: Secondary | ICD-10-CM | POA: Insufficient documentation

## 2021-03-03 DIAGNOSIS — Z992 Dependence on renal dialysis: Secondary | ICD-10-CM | POA: Insufficient documentation

## 2021-03-03 DIAGNOSIS — I120 Hypertensive chronic kidney disease with stage 5 chronic kidney disease or end stage renal disease: Secondary | ICD-10-CM | POA: Insufficient documentation

## 2021-03-03 DIAGNOSIS — F17210 Nicotine dependence, cigarettes, uncomplicated: Secondary | ICD-10-CM | POA: Diagnosis not present

## 2021-03-03 DIAGNOSIS — I480 Paroxysmal atrial fibrillation: Secondary | ICD-10-CM

## 2021-03-03 DIAGNOSIS — I4891 Unspecified atrial fibrillation: Secondary | ICD-10-CM | POA: Insufficient documentation

## 2021-03-03 DIAGNOSIS — R079 Chest pain, unspecified: Secondary | ICD-10-CM

## 2021-03-03 DIAGNOSIS — R0602 Shortness of breath: Secondary | ICD-10-CM | POA: Diagnosis present

## 2021-03-03 LAB — CBC
HCT: 33.3 % — ABNORMAL LOW (ref 39.0–52.0)
Hemoglobin: 10.6 g/dL — ABNORMAL LOW (ref 13.0–17.0)
MCH: 28.6 pg (ref 26.0–34.0)
MCHC: 31.8 g/dL (ref 30.0–36.0)
MCV: 90 fL (ref 80.0–100.0)
Platelets: 277 10*3/uL (ref 150–400)
RBC: 3.7 MIL/uL — ABNORMAL LOW (ref 4.22–5.81)
RDW: 14.4 % (ref 11.5–15.5)
WBC: 8.6 10*3/uL (ref 4.0–10.5)
nRBC: 0 % (ref 0.0–0.2)

## 2021-03-03 LAB — TROPONIN I (HIGH SENSITIVITY)
Troponin I (High Sensitivity): 55 ng/L — ABNORMAL HIGH (ref <18)
Troponin I (High Sensitivity): 52 ng/L — ABNORMAL HIGH (ref <18)

## 2021-03-03 LAB — BASIC METABOLIC PANEL
Anion gap: 18 — ABNORMAL HIGH (ref 5–15)
BUN: 96 mg/dL — ABNORMAL HIGH (ref 6–20)
CO2: 28 mmol/L (ref 22–32)
Calcium: 10 mg/dL (ref 8.9–10.3)
Chloride: 95 mmol/L — ABNORMAL LOW (ref 98–111)
Creatinine, Ser: 14.21 mg/dL — ABNORMAL HIGH (ref 0.61–1.24)
GFR, Estimated: 4 mL/min — ABNORMAL LOW (ref >60)
Glucose, Bld: 129 mg/dL — ABNORMAL HIGH (ref 70–99)
Potassium: 4.2 mmol/L (ref 3.5–5.1)
Sodium: 141 mmol/L (ref 135–145)

## 2021-03-03 LAB — BRAIN NATRIURETIC PEPTIDE: B Natriuretic Peptide: 788.7 pg/mL — ABNORMAL HIGH (ref 0.0–100.0)

## 2021-03-03 IMAGING — CR DG CHEST 2V
1 series · 2 of 2 positions shown · non-contrast
Comparison: [DATE]

CLINICAL DATA: Chest pain and shortness of breath.  Smoker.

EXAM:
CHEST - 2 VIEW

[Series 1: dg chest 2 view · 0.14mm/px · 2 of 2 slices shown]
[im 1/2]
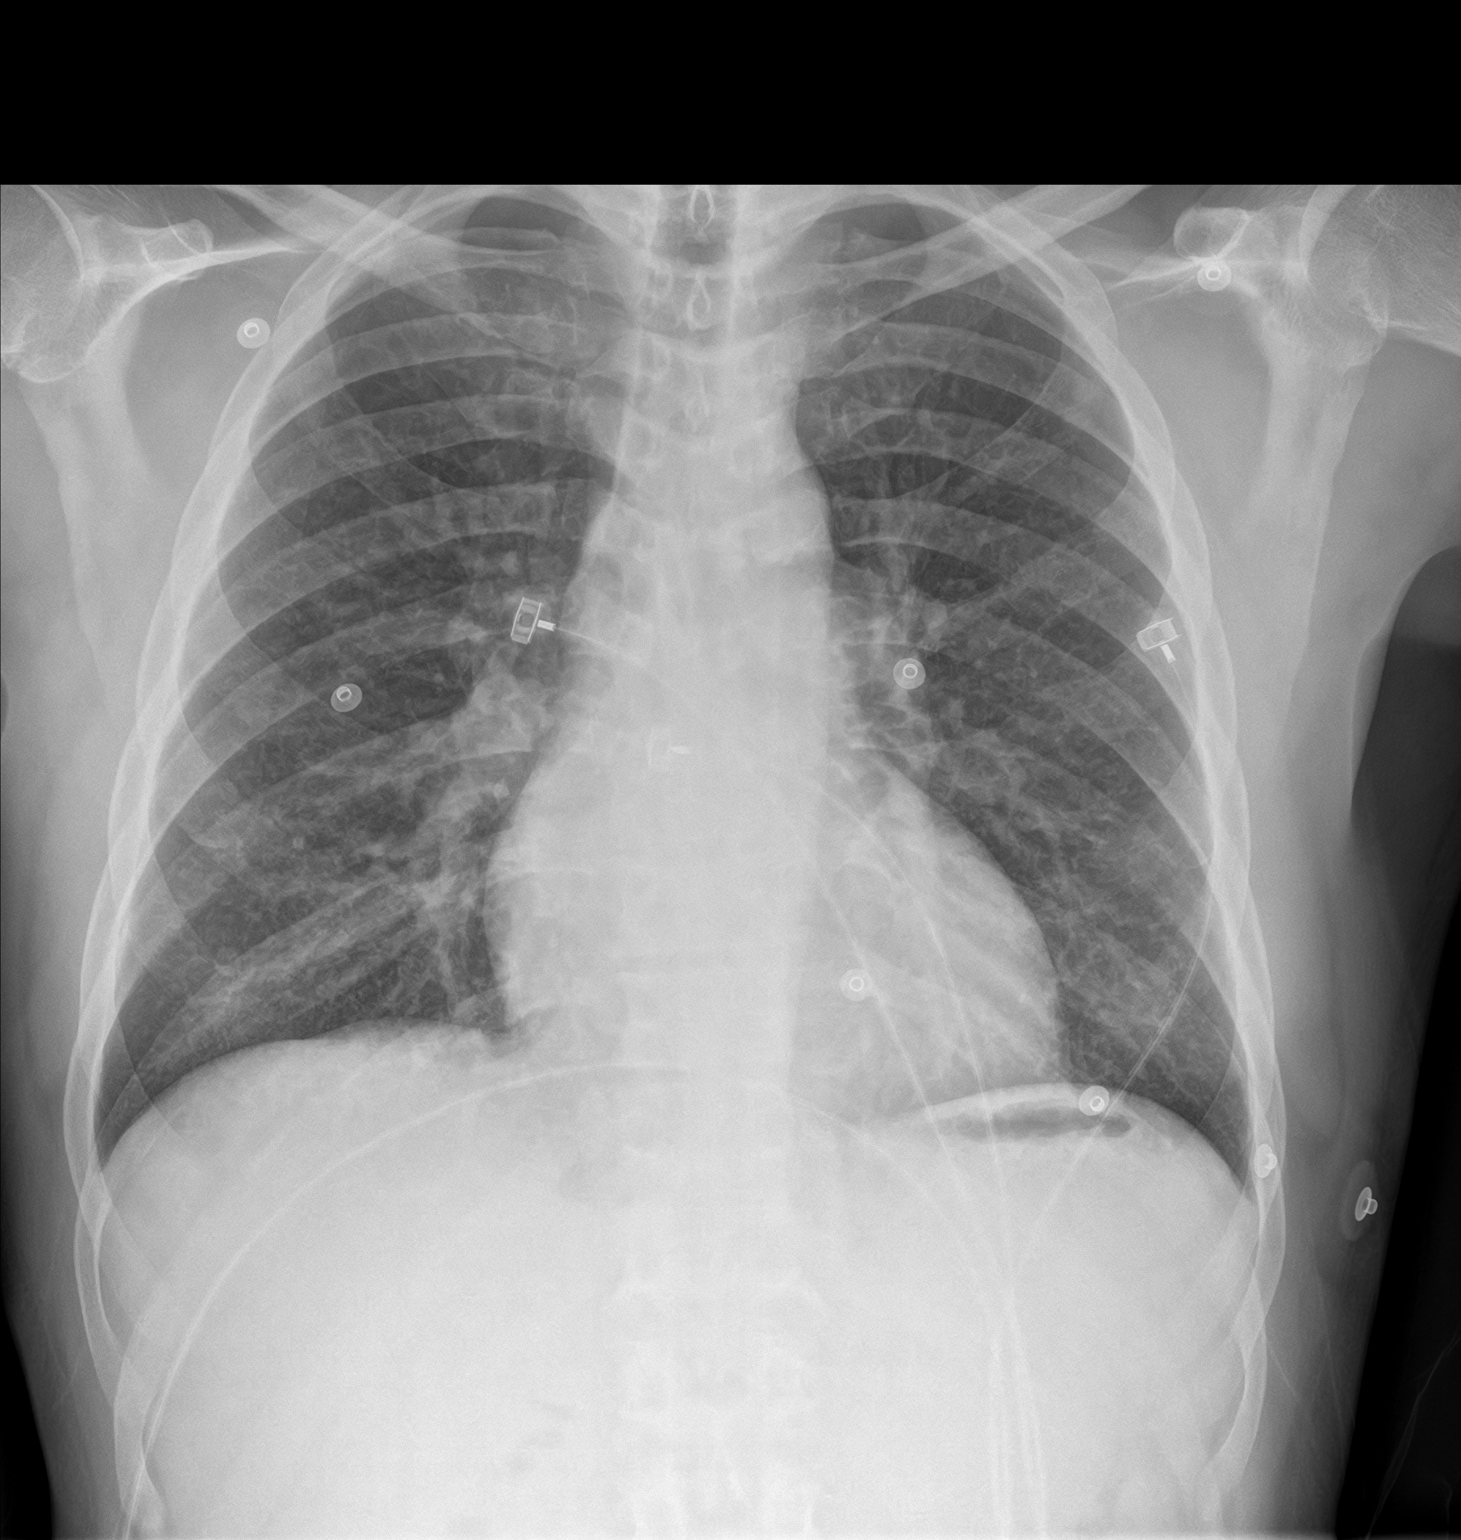
[im 2/2]
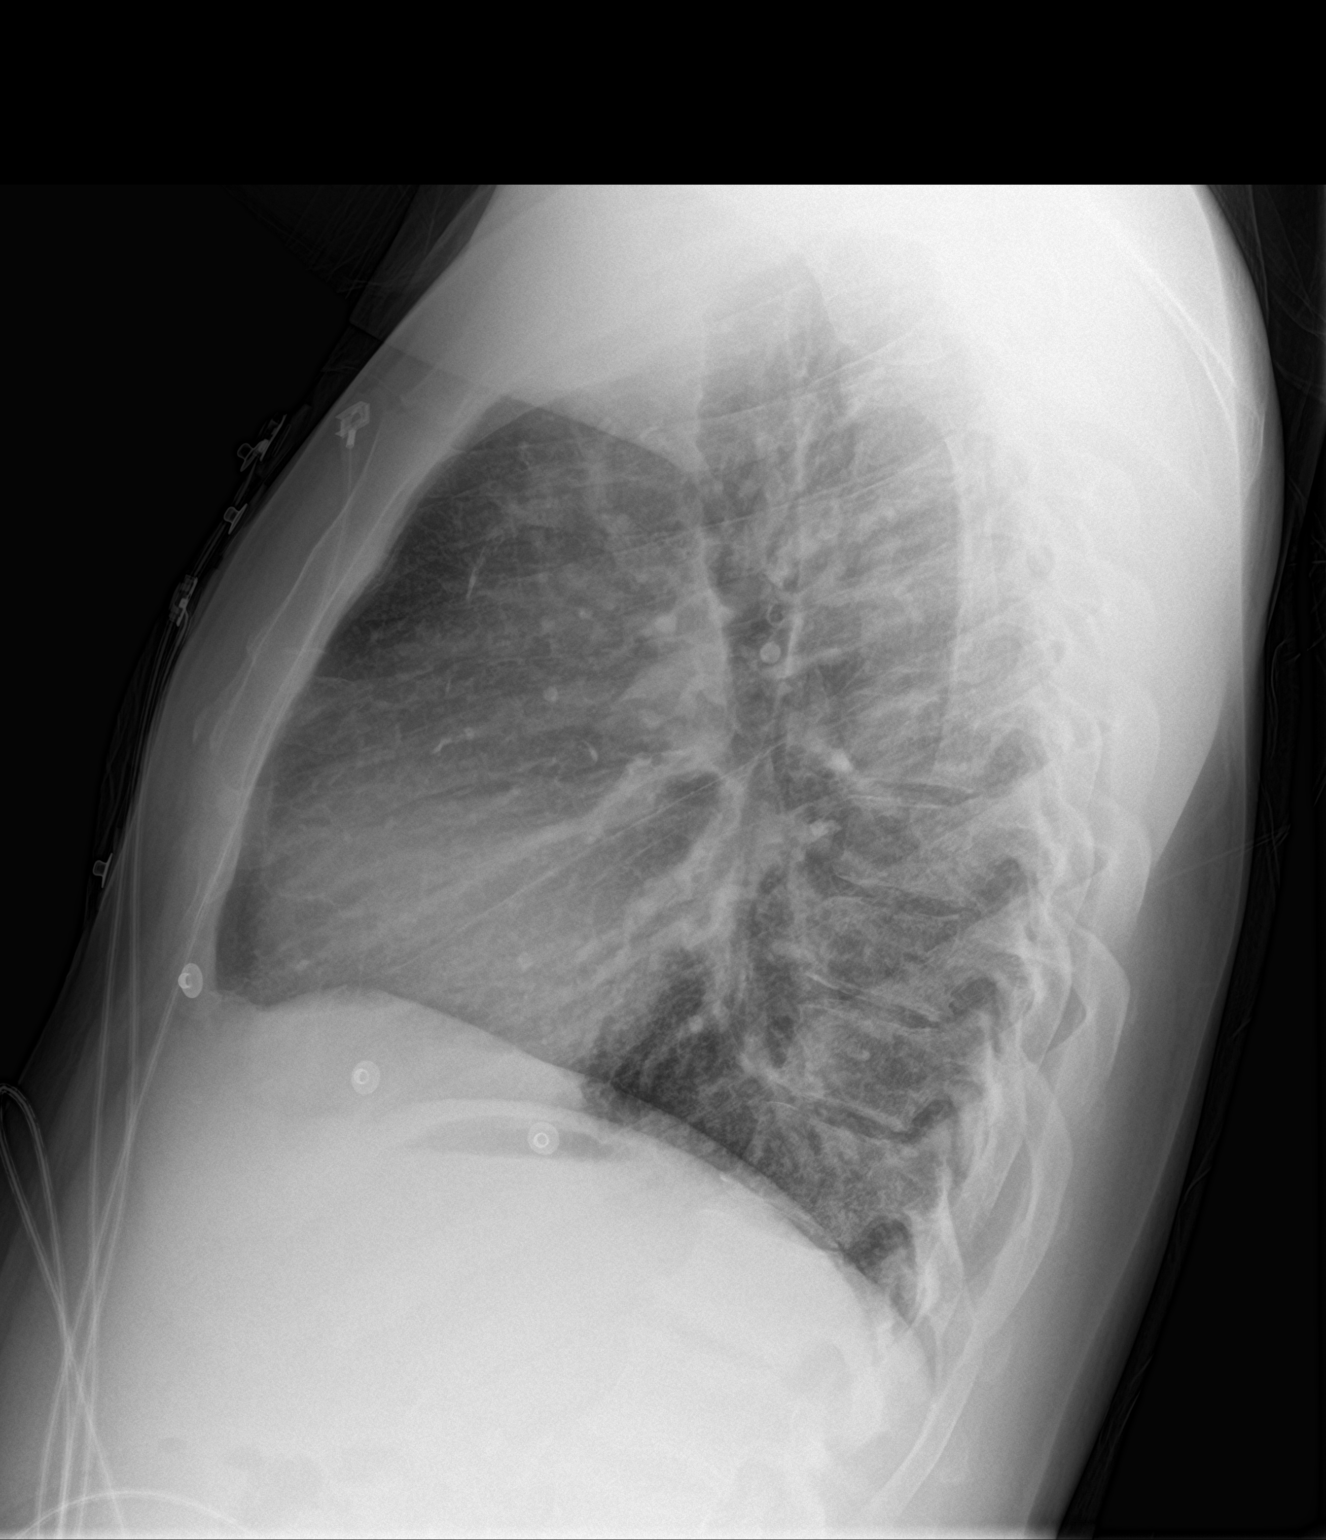

[2 of 2 positions shown; findings below may reference images not displayed]

FINDINGS: Normal sized heart. Clear lungs. Mild peribronchial thickening.
Unremarkable bones.
IMPRESSION: Mild bronchitic changes.

## 2021-03-03 NOTE — ED Triage Notes (Signed)
Pt via POV from home. Pt is a dialysis pt, pt c/o L sided CP that radiates down his L arm and SOB since this AM. Pt MWF dialysis and per family has not missed any appt recently.

## 2021-03-07 NOTE — ED Provider Notes (Signed)
[facility] Emergency Department Provider Note   ____________________________________________   Event Date/Time   First MD Initiated Contact with Patient 03/03/21 1233     (approximate)  I have reviewed the triage vital signs and the nursing notes.   HISTORY  Chief Complaint Chest Pain and Shortness of Breath    HPI William RADILLA Sr. is a 55 y.o. male with the below stated past medical history who presents for chest pain  LOCATION: Left-sided chest DURATION: 6 hours prior to arrival TIMING: Improved since onset SEVERITY: 10/10 QUALITY: Sharp CONTEXT: States began this morning upon awakening and has slightly improved since onset MODIFYING FACTORS: Denies any exertional worsening ASSOCIATED SYMPTOMS: Shortness of breath   Per medical record review patient is end-stage renal disease on dialysis M/W/F and states that he has not missed any dialysis treatments          Past Medical History:  Diagnosis Date   Chronic kidney disease    Dialysis patient [facility])    Hypertension     Patient Active Problem List   Diagnosis Date Noted   Chest pain 01/04/2015   HTN (hypertension) 01/04/2015   ESRD on hemodialysis ([place]) 01/04/2015    Past Surgical History:  Procedure Laterality Date   APPENDECTOMY     age 78   AV [place]      Prior to Admission medications   Medication Sig Start Date End Date Taking? Authorizing Provider  b complex-vitamin c-folic acid (NEPHRO-VITE) 0.8 MG TABS tablet Take 1 tablet by mouth daily.    [provider]  calcium acetate (PHOSLO) 667 MG capsule Take 1,334 mg by mouth 3 (three) times daily with meals.    [provider]  fluticasone (FLONASE) 50 MCG/ACT nasal spray Place 2 sprays into both nostrils 2 (two) times daily. 01/05/15   [name], MD  hydrALAZINE (APRESOLINE) 100 MG tablet Take 100 mg by mouth 3 (three) times daily.    [provider]  labetalol (NORMODYNE) 200 MG  tablet Take 600 mg by mouth 2 (two) times daily.    [provider]  lidocaine (LIDODERM) 5 % Place 1 patch onto the skin daily. Remove & Discard patch within 12 hours or as directed by MD 07/22/17   Gregor Hams, MD  minoxidil (LONITEN) 2.5 MG tablet Take 2.5 mg by mouth daily.    [provider]  nitroGLYCERIN (NITROSTAT) 0.3 MG SL tablet Place 0.3 mg under the tongue. 04/09/16 04/09/17  [provider]  omeprazole (PRILOSEC) 20 MG capsule Take 20 mg by mouth as needed. 09/26/10   [provider]  predniSONE (DELTASONE) 20 MG tablet 3 tabs po qd x 2 days, then 2 tabs po qd x 3 days, then 1 tab po qd x 3 days, then half a tab po qd x 2 days 11/04/19   Norval Gable, MD  ranitidine (ZANTAC) 150 MG tablet Take 150 mg by mouth as needed. 08/02/10   [provider]  sildenafil (VIAGRA) 100 MG tablet Take 100 mg by mouth daily as needed for erectile dysfunction.    [provider]    Allergies Patient has no known allergies.  Family History  Problem Relation Age of Onset   Heart failure Father    Heart disease Maternal Grandmother    Heart disease Paternal Grandmother     Social History Social History   Tobacco Use   Smoking status: Every Day    Packs/day: 0.25    Types: Cigarettes  Smokeless tobacco: Never   Tobacco comments:    1 pack every 3 days.  Vaping Use   Vaping Use: Never used  Substance Use Topics   Alcohol use: No   Drug use: Yes    Types: Marijuana    Review of Systems Constitutional: No fever/chills Eyes: No visual changes. ENT: No sore throat. Cardiovascular: Endorses chest pain. Respiratory: Denies shortness of breath. Gastrointestinal: No abdominal pain.  No nausea, no vomiting.  No diarrhea. Genitourinary: Negative for dysuria. Musculoskeletal: Negative for acute arthralgias Skin: Negative for rash. Neurological: Negative for headaches, weakness/numbness/paresthesias in any extremity Psychiatric:  Negative for suicidal ideation/homicidal ideation   ____________________________________________   PHYSICAL EXAM:  VITAL SIGNS: ED Triage Vitals  Enc Vitals Group     BP 03/03/21 1231 127/78     Pulse Rate 03/03/21 1231 100     Resp 03/03/21 1231 20     Temp 03/03/21 1231 97.8 F (36.6 C)     Temp Source 03/03/21 1231 Oral     SpO2 03/03/21 1231 100 %     Weight 03/03/21 1229 207 lb 3.7 oz (94 kg)     Height 03/03/21 1229 '6\' 2"'$  (1.88 m)     Head Circumference --      Peak Flow --      Pain Score 03/03/21 1229 10     Pain Loc --      Pain Edu? --      Excl. in [place]? --    Constitutional: Alert and oriented. Well appearing and in no acute distress. Eyes: Conjunctivae are normal. PERRL. Head: Atraumatic. Nose: No congestion/rhinnorhea. Mouth/Throat: Mucous membranes are moist. Neck: No stridor Cardiovascular: Grossly normal heart sounds.  Good peripheral circulation. Respiratory: Normal respiratory effort.  No retractions. Gastrointestinal: Soft and nontender. No distention. Musculoskeletal: No obvious deformities Neurologic:  Normal speech and language. No gross focal neurologic deficits are appreciated. Skin:  Skin is warm and dry. No rash noted. Psychiatric: Mood and affect are normal. Speech and behavior are normal.  ____________________________________________   LABS (all labs ordered are listed, but only abnormal results are displayed)  Labs Reviewed  BASIC METABOLIC PANEL - Abnormal; Notable for the following components:      Result Value   Chloride 95 (*)    Glucose, Bld 129 (*)    BUN 96 (*)    Creatinine, Ser 14.21 (*)    GFR, Estimated 4 (*)    Anion gap 18 (*)    All other components within normal limits  CBC - Abnormal; Notable for the following components:   RBC 3.70 (*)    Hemoglobin 10.6 (*)    HCT 33.3 (*)    All other components within normal limits  BRAIN NATRIURETIC PEPTIDE - Abnormal; Notable for the following components:   B Natriuretic  Peptide 788.7 (*)    All other components within normal limits  TROPONIN I (HIGH SENSITIVITY) - Abnormal; Notable for the following components:   Troponin I (High Sensitivity) 55 (*)    All other components within normal limits  TROPONIN I (HIGH SENSITIVITY) - Abnormal; Notable for the following components:   Troponin I (High Sensitivity) 52 (*)    All other components within normal limits   ____________________________________________  EKG  ED ECG REPORT I, Naaman Plummer, the attending physician, personally viewed and interpreted this ECG.  Date: 03/07/2021 EKG Time: 1227 Rate: 100 Rhythm: Atrial fibrillation QRS Axis: normal Intervals: normal ST/T Wave abnormalities: normal Narrative Interpretation: no evidence of acute ischemia  ____________________________________________  RADIOLOGY  ED MD interpretation: 2 view x-ray of the chest shows mild peribronchial thickening concerning for possible bronchitis  Official radiology report(s): No results found.  ____________________________________________   PROCEDURES  Procedure(s) performed (including Critical Care):  .1-3 Lead EKG Interpretation  Date/Time: 03/07/2021 9:43 AM Performed by: Naaman Plummer, MD Authorized by: Naaman Plummer, MD     Interpretation: normal     ECG rate:  78   ECG rate assessment: normal     Rhythm: sinus rhythm     Ectopy: none     Conduction: normal     ____________________________________________   INITIAL IMPRESSION / ASSESSMENT AND PLAN / ED COURSE  As part of my medical decision making, I reviewed the following data within the electronic medical record, if available:  Nursing notes reviewed and incorporated, Labs reviewed, EKG interpreted, Old chart reviewed, Radiograph reviewed and Notes from prior ED visits reviewed and incorporated        + atrial fibrillation that appears to be new onset DDx: Pneumothorax, Pneumonia, Pulmonary Embolus, Tamponade, ACS, Thyrotoxicosis.   No history or evidence decompensated heart failure. Given their history and exam it is likely this patient is unlikely to spontaneously revert to a rate controlled rhythm and necessitates a thorough workup for their arrhythmia. Workup: ECG, CXR, CBC, BMP, UA, Troponin, BNP,  Interventions: Defer Cardioversion (uncertain historical reliability with time of onset, increased risk of thromboembolic stroke).  Patient's rate well controlled without medications and returns to normal sinus rhythm spontaneously Downtrending troponin  Reassessment: Patient maintained NSR during multi-hour observation in ED. Disposition: Discharge home with prompt PCP follow up and cardiology referral.      ____________________________________________   FINAL CLINICAL IMPRESSION(S) / ED DIAGNOSES  Final diagnoses:  Chest pain, unspecified type  Paroxysmal atrial fibrillation [facility])     ED Discharge Orders     None        Note:  This document was prepared using Dragon voice recognition software and may include unintentional dictation errors.    Naaman Plummer, MD 03/07/21 [phone]

## 2021-04-22 ENCOUNTER — Other Ambulatory Visit (INDEPENDENT_AMBULATORY_CARE_PROVIDER_SITE_OTHER): Payer: Self-pay | Admitting: Nurse Practitioner

## 2021-04-22 DIAGNOSIS — R200 Anesthesia of skin: Secondary | ICD-10-CM

## 2021-04-22 DIAGNOSIS — N186 End stage renal disease: Secondary | ICD-10-CM

## 2021-04-23 ENCOUNTER — Ambulatory Visit (INDEPENDENT_AMBULATORY_CARE_PROVIDER_SITE_OTHER): Payer: Medicare Other

## 2021-04-23 ENCOUNTER — Other Ambulatory Visit: Payer: Self-pay

## 2021-04-23 ENCOUNTER — Ambulatory Visit (INDEPENDENT_AMBULATORY_CARE_PROVIDER_SITE_OTHER): Payer: Medicare Other | Admitting: Nurse Practitioner

## 2021-04-23 ENCOUNTER — Encounter (INDEPENDENT_AMBULATORY_CARE_PROVIDER_SITE_OTHER): Payer: Self-pay | Admitting: Nurse Practitioner

## 2021-04-23 VITALS — BP 130/72 | HR 79 | Resp 16 | Ht 74.0 in | Wt 211.0 lb

## 2021-04-23 DIAGNOSIS — N186 End stage renal disease: Secondary | ICD-10-CM | POA: Diagnosis not present

## 2021-04-23 DIAGNOSIS — F17210 Nicotine dependence, cigarettes, uncomplicated: Secondary | ICD-10-CM

## 2021-04-23 DIAGNOSIS — Z992 Dependence on renal dialysis: Secondary | ICD-10-CM

## 2021-04-23 DIAGNOSIS — I10 Essential (primary) hypertension: Secondary | ICD-10-CM

## 2021-04-23 DIAGNOSIS — R200 Anesthesia of skin: Secondary | ICD-10-CM | POA: Diagnosis not present

## 2021-04-23 DIAGNOSIS — F17200 Nicotine dependence, unspecified, uncomplicated: Secondary | ICD-10-CM | POA: Diagnosis not present

## 2021-05-04 ENCOUNTER — Encounter (INDEPENDENT_AMBULATORY_CARE_PROVIDER_SITE_OTHER): Payer: Self-pay | Admitting: Nurse Practitioner

## 2021-05-04 NOTE — Progress Notes (Signed)
Subjective:    Patient ID: William Hobbs., male    DOB: 21-Jan-1966, 55 y.o.   MRN: [id number] Chief Complaint  Patient presents with   New Patient (Initial Visit)    Ref [facility] consult left hand numbness during treatment    William Hobbs is a 55 year old male that presents today for follow-up evaluation regarding numbness in his left hand and concerned that it may be related to steal syndrome.  The patient notes that it feels as if there is a shock in his left hand at times.  He notes that his fingers will get numb more so at the tips.  He also has pain that radiates through his arm when he is driving.  He does not note any drastic difference in pain during dialysis.  He has previously had a carpal tunnel release of his left hand which helped a lot in regards to his numbness and tingling.  He also notes that he has a similar sensation in the right hand it is just not as bad.  He also notes that the numbness and tingling is worse when he wakes up in the morning if he sleeps in certain positions.  He denies any wounds or ulcerations.  Today noninvasive studies show flow volume of 20-22.  There is currently no evidence of steal.  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   Review of Systems  Neurological:  Positive for numbness.      Objective:   Physical Exam Vitals reviewed.  Cardiovascular:     Rate and Rhythm: Normal rate.     Pulses: Normal pulses.          Carotid pulses are 2+ on the left side.    Arteriovenous access: Left arteriovenous access is present.    Comments: Thrill and bruit of left radiocephalic AV fistula Pulmonary:     Effort: Pulmonary effort is normal.  Neurological:     Mental Status: He is alert and oriented to person, place, and time.  Psychiatric:        Mood and Affect: Mood normal.        Behavior: Behavior normal.        Thought Content: Thought content normal.        Judgment: Judgment normal.    BP 130/72 (BP Location: Right Arm)    Pulse 79   Resp 16   Ht '6\' 2"'$  (1.88 m)   Wt 211 lb (95.7 kg)   BMI 27.09 kg/m   Past Medical History:  Diagnosis Date   Chronic kidney disease    Dialysis patient ([place])    Hypertension     Social History   Socioeconomic History   Marital status: Married    Spouse name: Not on file   Number of children: Not on file   Years of education: Not on file   Highest education level: Not on file  Occupational History   Not on file  Tobacco Use   Smoking status: Every Day    Packs/day: 0.25    Types: Cigarettes   Smokeless tobacco: Never   Tobacco comments:    1 pack every 3 days.  Vaping Use   Vaping Use: Never used  Substance and Sexual Activity   Alcohol use: No   Drug use: Yes    Types: Marijuana   Sexual activity: Not on file  Other Topics Concern   Not on file  Social History Narrative   Not on file  Social Determinants of Health   Financial Resource Strain: Not on file  Food Insecurity: Not on file  Transportation Needs: Not on file  Physical Activity: Not on file  Stress: Not on file  Social Connections: Not on file  Intimate Partner Violence: Not on file    Past Surgical History:  Procedure Laterality Date   APPENDECTOMY     age 38   AV FISTULA PLACEMENT      Family History  Problem Relation Age of Onset   Heart failure Father    Heart disease Maternal Grandmother    Heart disease Paternal Grandmother     No Known Allergies  CBC Latest Ref Rng & Units 03/03/2021 01/04/2015 01/04/2015  WBC 4.0 - 10.5 K/uL 8.6 5.4 6.0  Hemoglobin 13.0 - 17.0 g/dL 10.6(L) 9.9(L) 10.0(L)  Hematocrit 39.0 - 52.0 % 33.3(L) 30.7(L) 31.1(L)  Platelets 150 - 400 K/uL 277 187 194      CMP     Component Value Date/Time   NA 141 03/03/2021 1248   NA 142 12/20/2014 1212   K 4.2 03/03/2021 1248   K 3.5 12/20/2014 1212   CL 95 (L) 03/03/2021 1248   CL 98 (L) 12/20/2014 1212   CO2 28 03/03/2021 1248   CO2 32 12/20/2014 1212   GLUCOSE 129 (H) 03/03/2021 1248    GLUCOSE 76 12/20/2014 1212   BUN 96 (H) 03/03/2021 1248   BUN 19 12/20/2014 1212   CREATININE 14.21 (H) 03/03/2021 1248   CREATININE 7.30 (H) 12/20/2014 1212   CALCIUM 10.0 03/03/2021 1248   CALCIUM 8.9 12/20/2014 1212   PROT 7.1 12/20/2014 1212   ALBUMIN 3.7 12/20/2014 1212   AST 14 (L) 12/20/2014 1212   ALT 12 (L) 12/20/2014 1212   ALKPHOS 66 12/20/2014 1212   BILITOT 0.3 12/20/2014 1212   GFRNONAA 4 (L) 03/03/2021 1248   GFRNONAA 8 (L) 12/20/2014 1212   GFRAA 7 (L) 01/04/2015 1016   GFRAA 9 (L) 12/20/2014 1212     No results found.     Assessment & Plan:   1. ESRD (end stage renal disease) on dialysis [facility]) Recommend:  The patient is doing well and currently has adequate dialysis access. The patient's dialysis center is not reporting any access issues. Flow pattern is stable when compared to the prior ultrasound.  Ultrasound today shows no evidence of steal.  Discussion about arm numbness as noted below.  The patient should have a duplex ultrasound of the dialysis access in 6 months. The patient will follow-up with me in the office after each ultrasound     2. Primary hypertension Continue antihypertensive medications as already ordered, these medications have been reviewed and there are no changes at this time.   3. Arm numbness Based on patient's description of his pain it may be related to the carpal tunnel syndrome that he had previously treated at [facility].  It is also possible this could be related to an issue with his cervical spine area.  The patient does note this numbness sensation in his right hand as well.  The sensation also changes depending on his position and how he holds his head and neck for extended periods the patient notes that he is also very symptomatic with sleeping in certain positions.  Because of this we will send the patient to neurosurgery for further evaluation.  - Ambulatory referral to Neurosurgery  4. Tobacco use disorder Smoking cessation  was discussed, 3-10 minutes spent on this topic specifically    Current  Outpatient Medications on File Prior to Visit  Medication Sig Dispense Refill   b complex-vitamin c-folic acid (NEPHRO-VITE) 0.8 MG TABS tablet Take 1 tablet by mouth daily.     calcium acetate (PHOSLO) 667 MG capsule Take 1,334 mg by mouth 3 (three) times daily with meals.     fluticasone (FLONASE) 50 MCG/ACT nasal spray Place 2 sprays into both nostrils 2 (two) times daily. 1 g 0   gabapentin (NEURONTIN) 100 MG capsule Take by mouth.     hydrALAZINE (APRESOLINE) 100 MG tablet Take 100 mg by mouth 3 (three) times daily.     labetalol (NORMODYNE) 200 MG tablet Take 600 mg by mouth 2 (two) times daily.     minoxidil (LONITEN) 2.5 MG tablet Take 2.5 mg by mouth as needed.     omeprazole (PRILOSEC) 20 MG capsule Take 20 mg by mouth as needed.     sildenafil (VIAGRA) 100 MG tablet Take 100 mg by mouth daily as needed for erectile dysfunction.     lidocaine (LIDODERM) 5 % Place 1 patch onto the skin daily. Remove & Discard patch within 12 hours or as directed by MD (Patient not taking: Reported on 04/23/2021) 30 patch 0   nitroGLYCERIN (NITROSTAT) 0.3 MG SL tablet Place 0.3 mg under the tongue.     predniSONE (DELTASONE) 20 MG tablet 3 tabs po qd x 2 days, then 2 tabs po qd x 3 days, then 1 tab po qd x 3 days, then half a tab po qd x 2 days (Patient not taking: No sig reported) 16 tablet 0   ranitidine (ZANTAC) 150 MG tablet Take 150 mg by mouth as needed. (Patient not taking: No sig reported)     No current facility-administered medications on file prior to visit.    There are no Patient Instructions on file for this visit. No follow-ups on file.   Kris Hartmann, NP

## 2021-05-23 ENCOUNTER — Other Ambulatory Visit: Payer: Self-pay | Admitting: Neurosurgery

## 2021-05-23 DIAGNOSIS — G959 Disease of spinal cord, unspecified: Secondary | ICD-10-CM

## 2021-05-23 DIAGNOSIS — M5416 Radiculopathy, lumbar region: Secondary | ICD-10-CM

## 2021-06-03 ENCOUNTER — Ambulatory Visit
Admission: RE | Admit: 2021-06-03 | Discharge: 2021-06-03 | Disposition: A | Discharge status: Home / Self Care | Payer: Medicare Other | Source: Ambulatory Visit | Attending: Neurosurgery | Admitting: Neurosurgery

## 2021-06-03 ENCOUNTER — Other Ambulatory Visit: Payer: Self-pay

## 2021-06-03 DIAGNOSIS — M5416 Radiculopathy, lumbar region: Secondary | ICD-10-CM

## 2021-06-03 DIAGNOSIS — G959 Disease of spinal cord, unspecified: Secondary | ICD-10-CM | POA: Insufficient documentation

## 2021-06-03 IMAGING — MR MR LUMBAR SPINE W/O CM
5 series · 34 of 48 positions shown · non-contrast
Comparison: None.

CLINICAL DATA: Low back pain

EXAM:
MRI LUMBAR SPINE WITHOUT CONTRAST
TECHNIQUE: Multiplanar, multisequence MR imaging of the lumbar spine was
performed. No intravenous contrast was administered.

[Series 5: T2 · sagittal · 4.0mm · 1.09mm/px · 7 of 15 slices shown (1 of 2)]
[im 1/15]
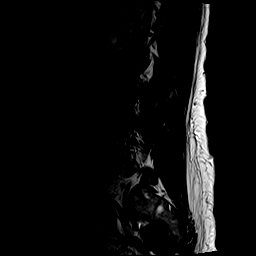
[im 3/15]
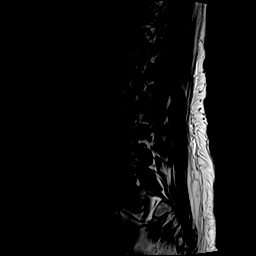
[im 5/15]
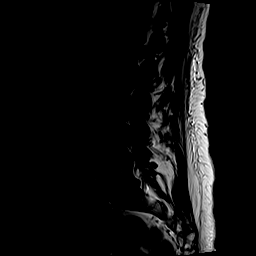
[im 8/15]
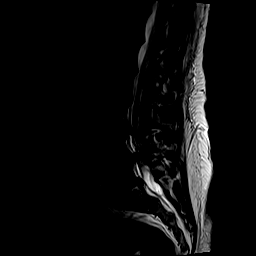
[im 10/15]
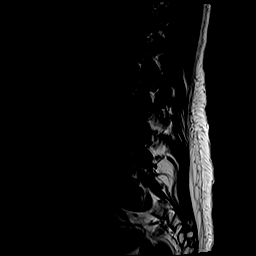
[im 12/15]
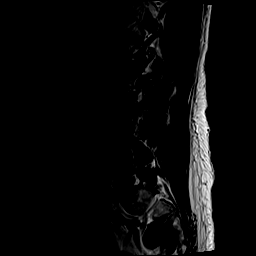
[im 15/15]
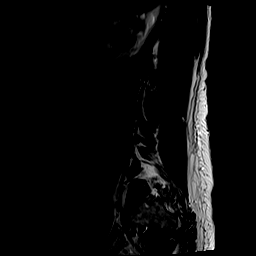

[Series 6: T1 · sagittal · 4.0mm · 1.09mm/px · 7 of 15 slices shown (1 of 2)]
[im 1/15]
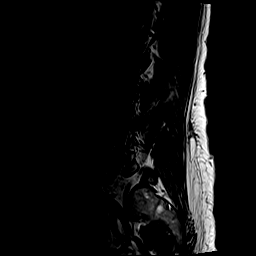
[im 3/15]
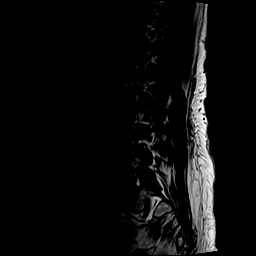
[im 5/15]
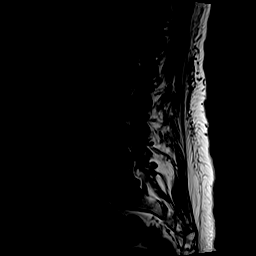
[im 8/15]
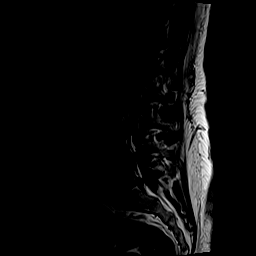
[im 10/15]
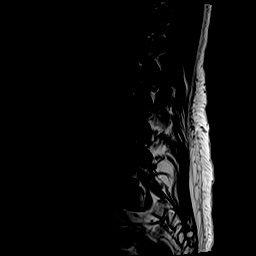
[im 12/15]
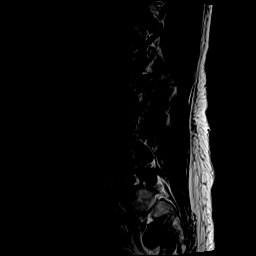
[im 15/15]
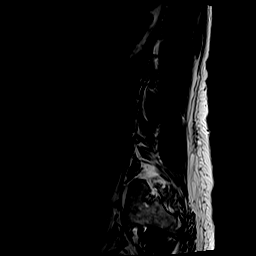

[Series 7: STIR · sagittal · 4.0mm · 0.55mm/px · 4 of 15 slices shown]
[im 1/15]
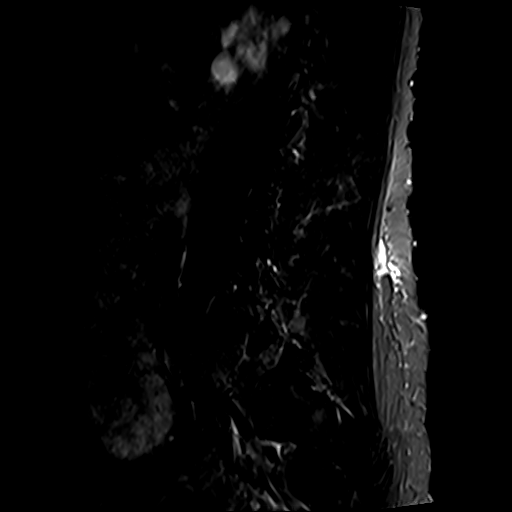
[im 3/15]
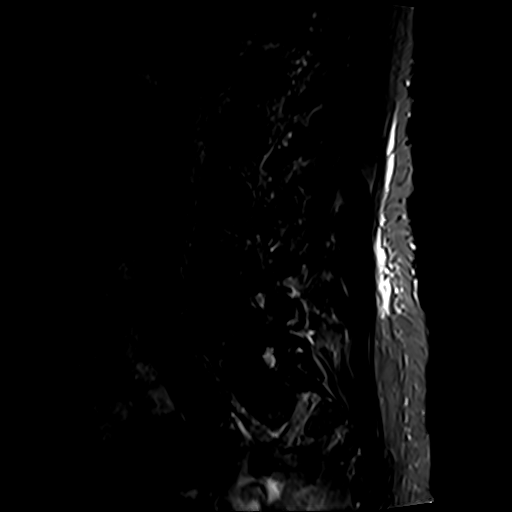
[im 6/15]
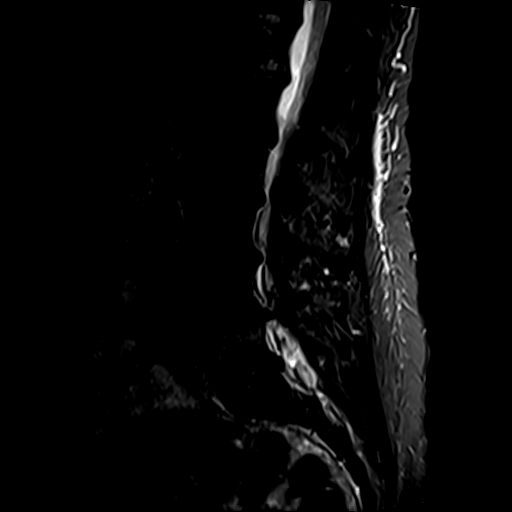
[im 9/15]
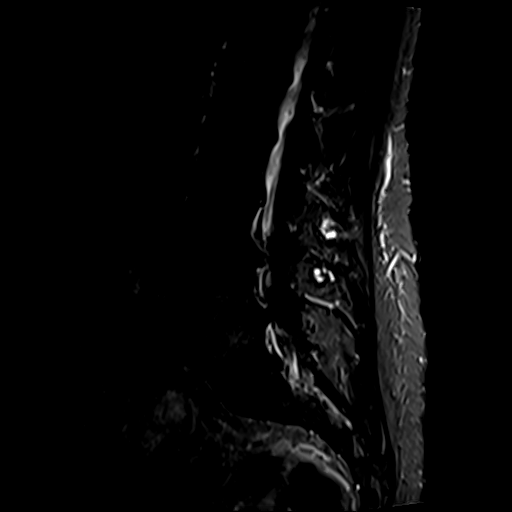

[Series 8: T2 · axial · 4.0mm · 0.78mm/px · z∈[-91,+118]mm · 8 of 34 slices shown (2 of 2)]
[im 1/34]
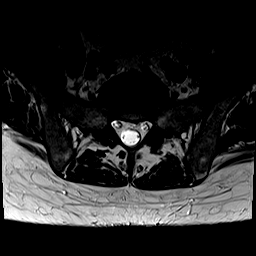
[im 6/34]
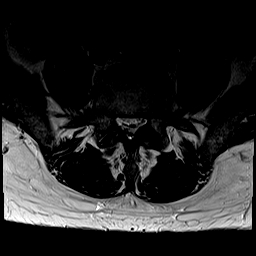
[im 11/34]
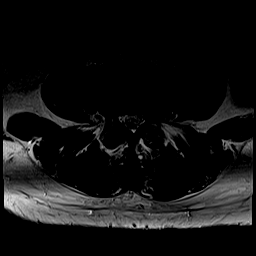
[im 16/34]
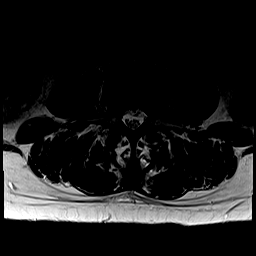
[im 18/34]
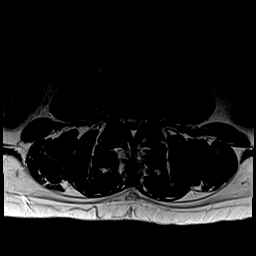
[im 23/34]
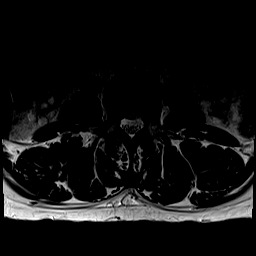
[im 28/34]
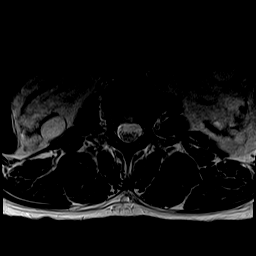
[im 34/34]
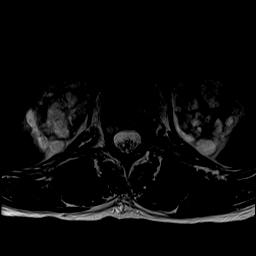

[Series 9: T1 · axial · 4.0mm · 0.39mm/px · z∈[-91,+118]mm · 8 of 34 slices shown (2 of 2)]
[im 1/34]
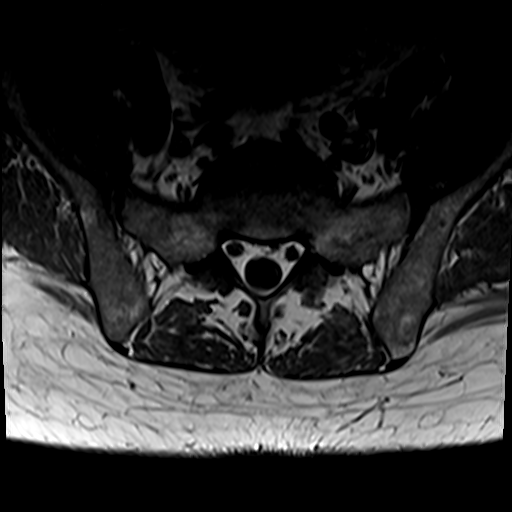
[im 6/34]
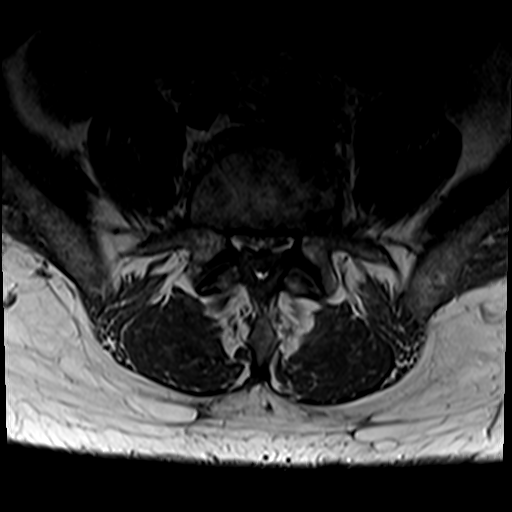
[im 11/34]
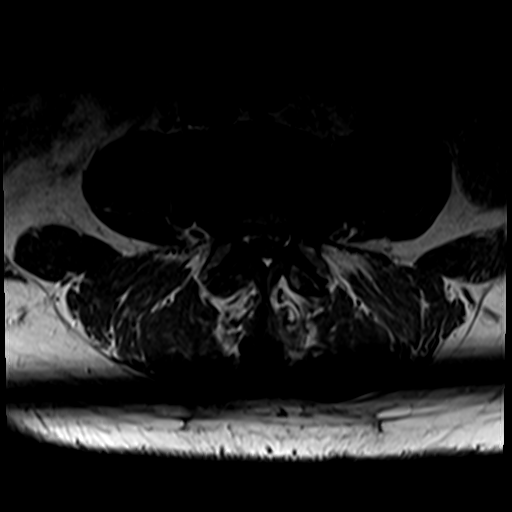
[im 16/34]
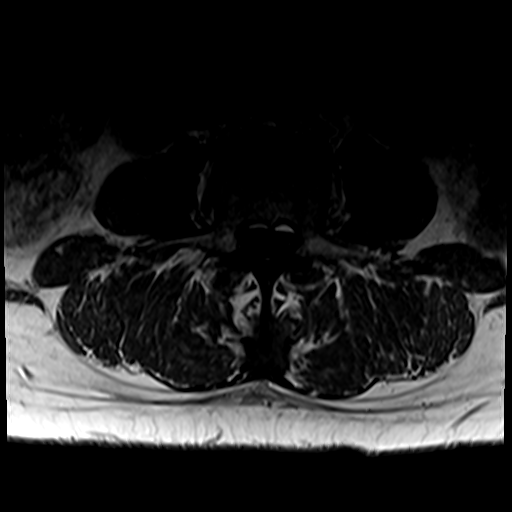
[im 18/34]
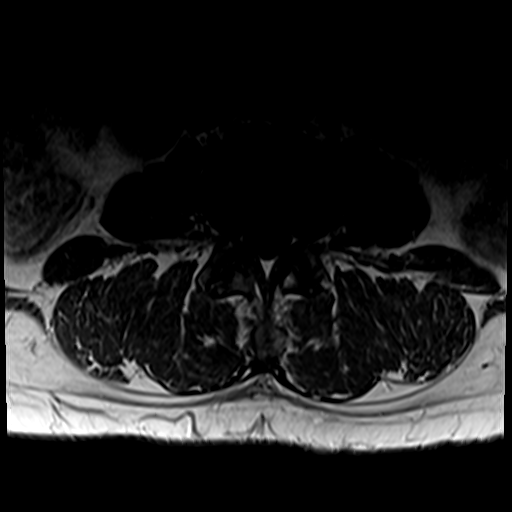
[im 23/34]
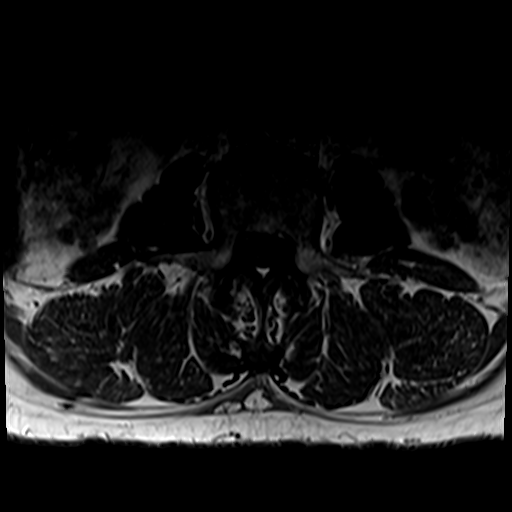
[im 28/34]
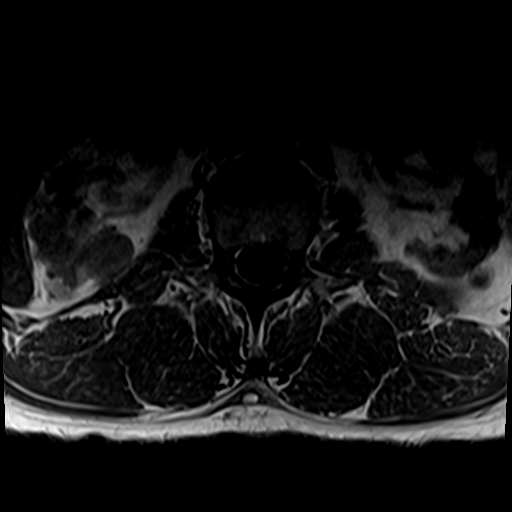
[im 34/34]
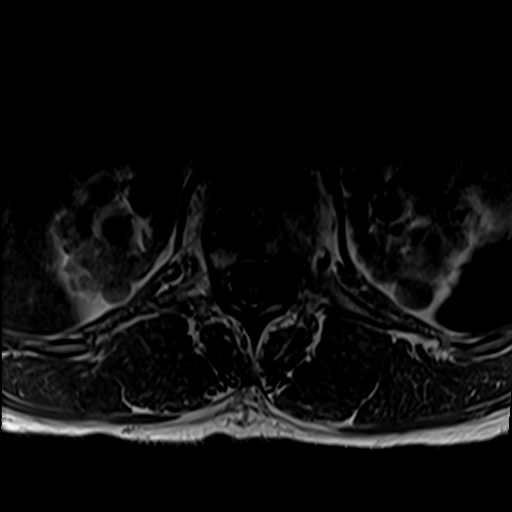

[34 of 48 positions shown; findings below may reference images not displayed]

FINDINGS: Segmentation: 5 lumbar type vertebrae based on the available
coverage

Alignment:  Physiologic.

Vertebrae: Degenerative fatty marrow conversion at L4-5 endplates.
No fracture or discitis.

Conus medullaris and cauda equina: Conus extends to the upper L1
level. Conus and cauda equina appear normal.

Paraspinal and other soft tissues: Polycystic and atrophic kidneys.

Disc levels:

T12- L1: Disc narrowing and bulging.

L1-L2: Disc narrowing and bulging with broad central protrusion.
Mild facet spurring and ligamentum flavum thickening. Moderate
narrowing of the thecal sac

L2-L3: Disc narrowing and bulging with annular fissure. Mild
ligamentum flavum thickening. Mild spinal stenosis.

L3-L4: Disc narrowing and bulging with broad protrusion extending
into inferior foramina on the left more than right. Mild facet
spurring and ligamentum flavum thickening. High-grade spinal
stenosis.

L4-L5: Disc collapse and endplate degeneration with disc bulging and
superimposed left paracentral protrusion. Mild facet spurring.
Severe spinal stenosis. Moderate biforaminal impingement

L5-S1:Disc narrowing and bulging with small central protrusion. Mild
facet spurring
IMPRESSION: 1. Generalized lumbar spine degeneration with focally advanced disc
disease at L4-5 where there is advanced spinal stenosis and moderate
bilateral foraminal impingement.
2. High-grade spinal stenosis also noted at L3-4. Mild to moderate
spinal stenosis at L1-2 and L2-3.

## 2021-06-03 IMAGING — MR MR CERVICAL SPINE W/O CM
5 series · 40 of 48 positions shown · non-contrast
Comparison: None.

CLINICAL DATA: Neck pain radiating down both arms with numbness in
the left hand

EXAM:
MRI CERVICAL SPINE WITHOUT CONTRAST
TECHNIQUE: Multiplanar, multisequence MR imaging of the cervical spine was
performed. No intravenous contrast was administered.

[Series 5: T2 · sagittal · 3.0mm · 0.62mm/px · 6 of 15 slices shown (1 of 2)]
[im 1/15]
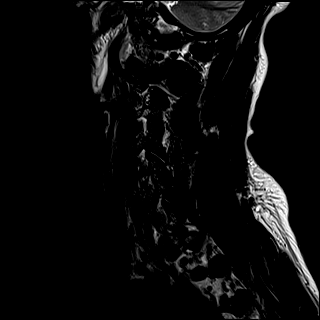
[im 3/15]
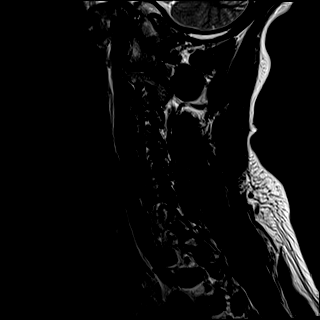
[im 6/15]
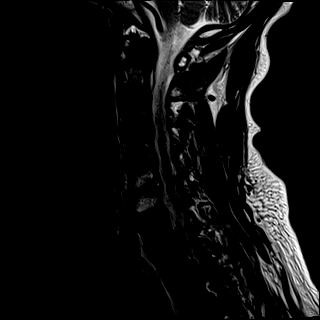
[im 9/15]
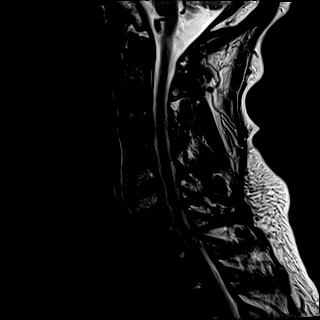
[im 12/15]
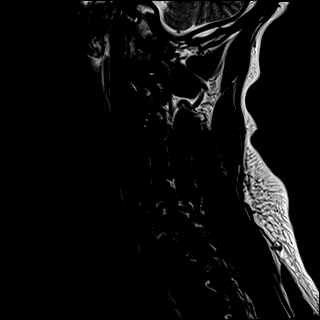
[im 15/15]
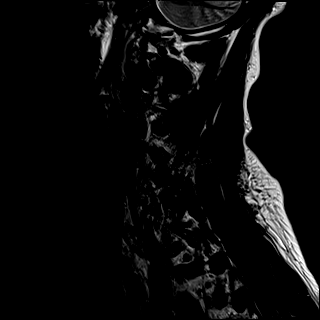

[Series 6: FLAIR · sagittal · 3.0mm · 0.78mm/px · 7 of 15 slices shown]
[im 1/15]
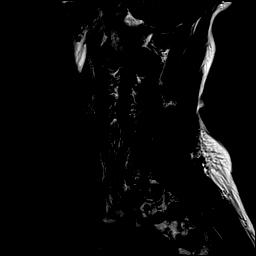
[im 3/15]
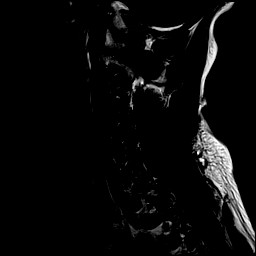
[im 5/15]
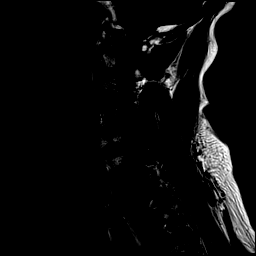
[im 8/15]
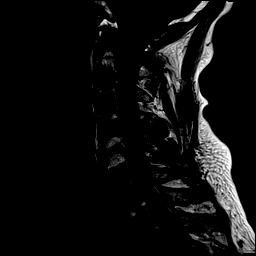
[im 10/15]
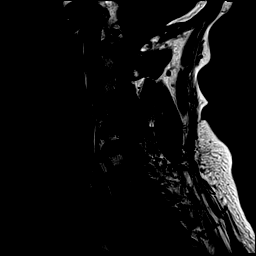
[im 12/15]
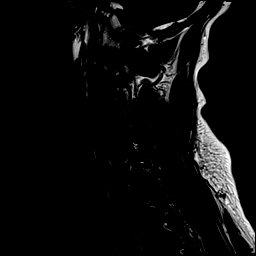
[im 15/15]
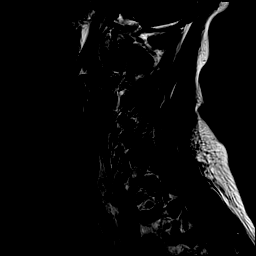

[Series 7: STIR · sagittal · 3.0mm · 0.62mm/px · 7 of 15 slices shown]
[im 1/15]
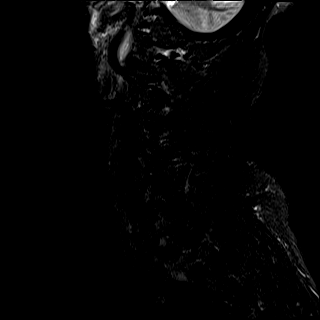
[im 3/15]
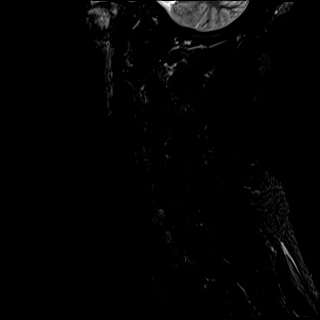
[im 5/15]
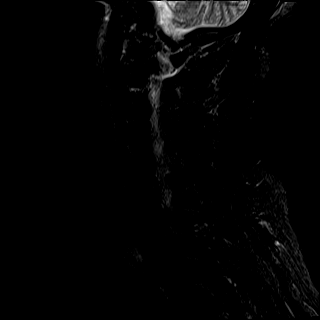
[im 8/15]
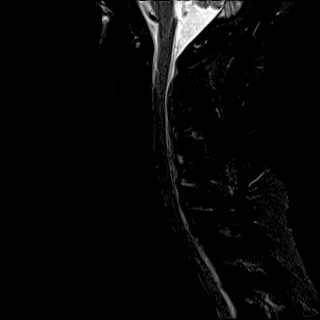
[im 10/15]
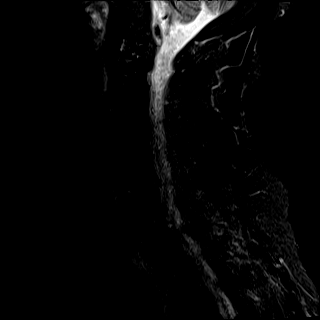
[im 12/15]
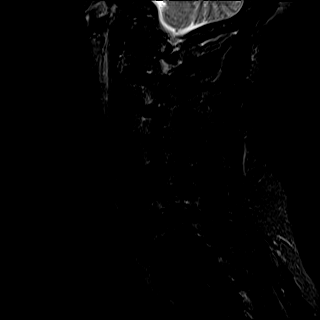
[im 15/15]
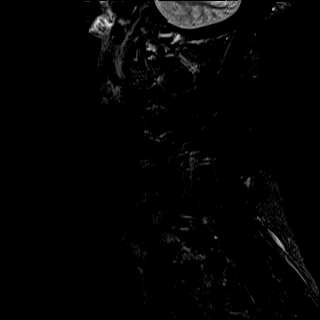

[Series 8: T2 · axial · 3.0mm · 0.70mm/px · z∈[-95,+15]mm · 12 of 32 slices shown (2 of 2)]
[im 1/32]
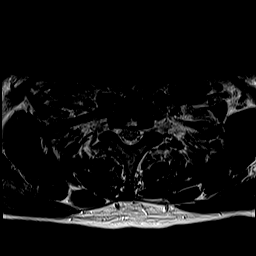
[im 3/32]
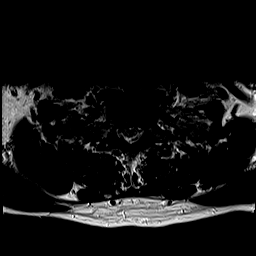
[im 5/32]
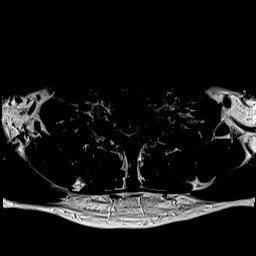
[im 8/32]
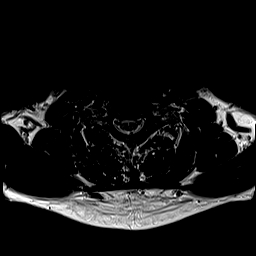
[im 10/32]
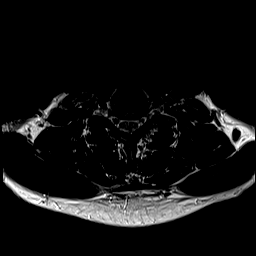
[im 12/32]
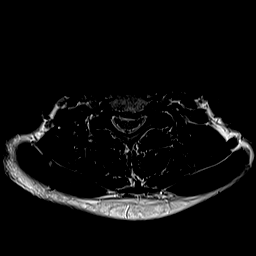
[im 15/32]
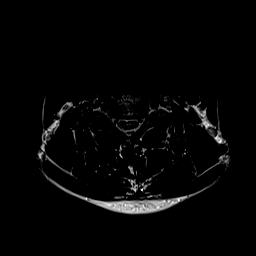
[im 17/32]
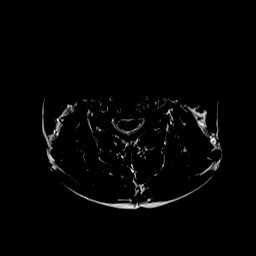
[im 20/32]
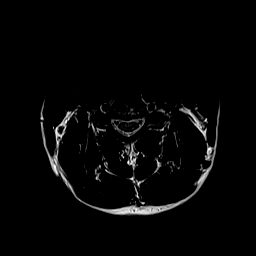
[im 22/32]
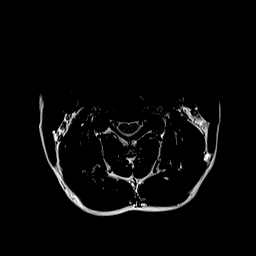
[im 27/32]
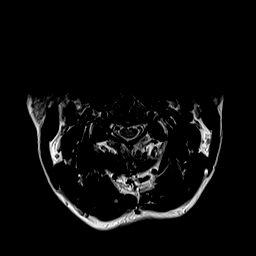
[im 32/32]
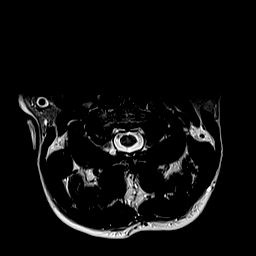

[Series 9: ax mpgr · axial · 3.0mm · 0.35mm/px · z∈[-95,+15]mm · 8 of 33 slices shown]
[im 1/33]
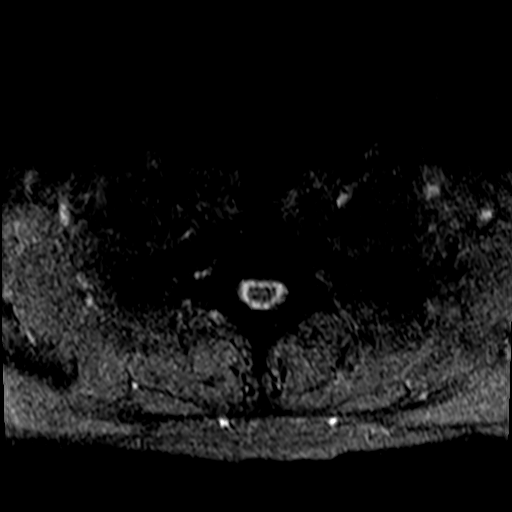
[im 5/33]
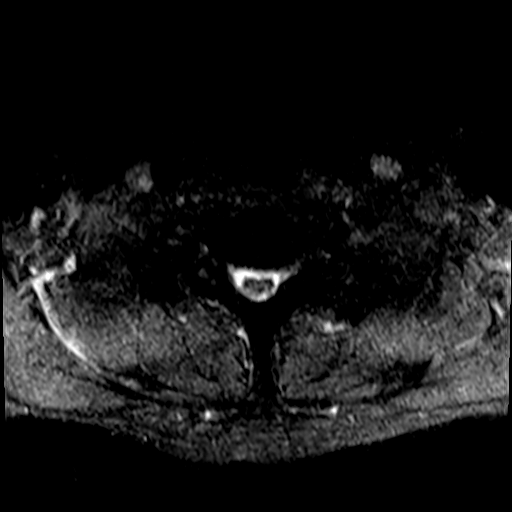
[im 10/33]
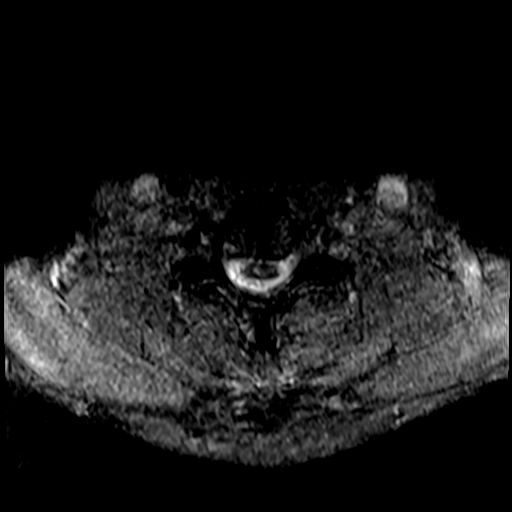
[im 15/33]
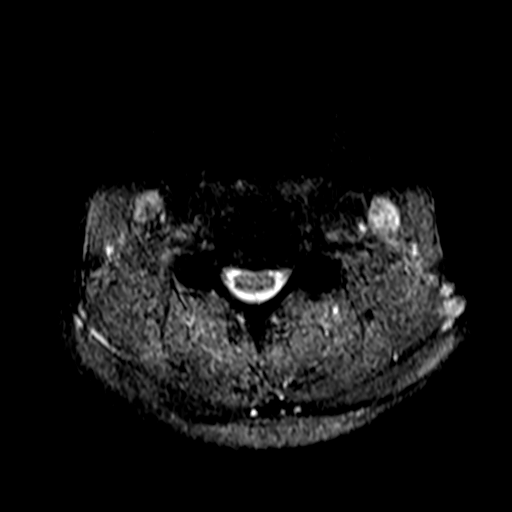
[im 18/33]
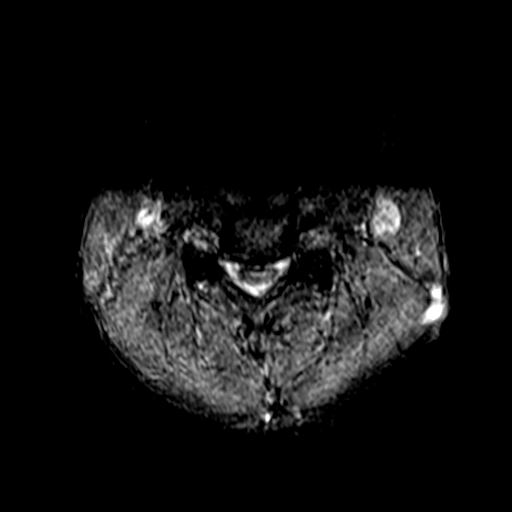
[im 23/33]
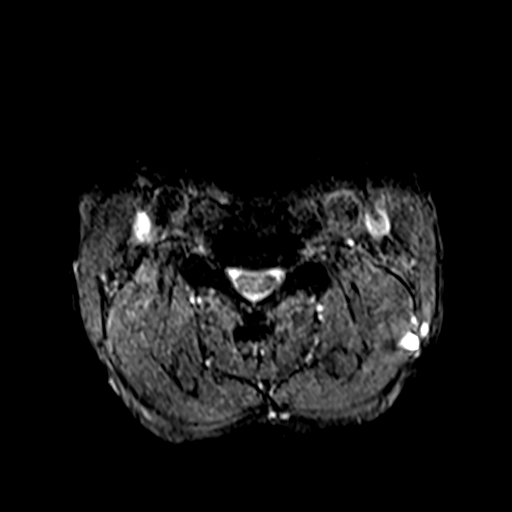
[im 28/33]
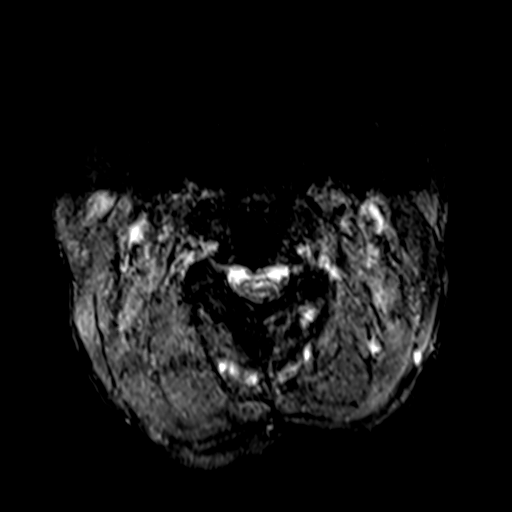
[im 33/33]
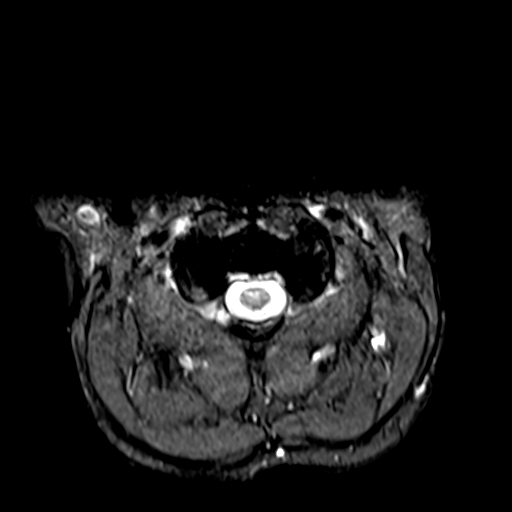

[40 of 48 positions shown; findings below may reference images not displayed]

FINDINGS: Alignment: Slight degenerative anterolisthesis at C7-T1.

Vertebrae: No fracture, evidence of discitis, or bone lesion.

Cord: Normal signal and morphology.

Posterior Fossa, vertebral arteries, paraspinal tissues: Negative.

Disc levels:

C2-3: Unremarkable.

C3-4: Minor disc bulging

C4-5: Mild disc bulging

C5-6: Disc narrowing and endplate degeneration with endplate and
uncovertebral ridging. Biforaminal impingement. Patent spinal canal

C6-7: Mild disc bulging with small central protrusion

C7-T1:Facet spurring and mild anterolisthesis. Bulging disc with
mild to moderate left foraminal narrowing.

Motion degraded.
IMPRESSION: 1. C5-6 focally progressed disc degeneration with biforaminal
impingement.
2. C7-T1 mild to moderate left foraminal narrowing primarily from
facet osteoarthritis with mild anterolisthesis.

## 2021-07-23 ENCOUNTER — Other Ambulatory Visit: Payer: Self-pay

## 2021-07-23 ENCOUNTER — Other Ambulatory Visit: Payer: Self-pay | Admitting: Orthopedic Surgery

## 2021-07-23 ENCOUNTER — Other Ambulatory Visit
Admission: RE | Admit: 2021-07-23 | Discharge: 2021-07-23 | Disposition: A | Discharge status: Home / Self Care | Payer: Medicare Other | Source: Ambulatory Visit | Attending: Orthopedic Surgery | Admitting: Orthopedic Surgery

## 2021-07-23 ENCOUNTER — Ambulatory Visit
Admission: RE | Admit: 2021-07-23 | Discharge: 2021-07-23 | Disposition: A | Discharge status: Home / Self Care | Payer: Medicare Other | Source: Ambulatory Visit | Attending: Orthopedic Surgery | Admitting: Orthopedic Surgery

## 2021-07-23 DIAGNOSIS — M79662 Pain in left lower leg: Secondary | ICD-10-CM | POA: Insufficient documentation

## 2021-07-23 DIAGNOSIS — M25462 Effusion, left knee: Secondary | ICD-10-CM | POA: Insufficient documentation

## 2021-07-23 LAB — SYNOVIAL CELL COUNT + DIFF, W/ CRYSTALS
Crystals, Fluid: NONE SEEN
Eosinophils-Synovial: 0 %
Lymphocytes-Synovial Fld: 1 %
Monocyte-Macrophage-Synovial Fluid: 7 %
Neutrophil, Synovial: 92 %
WBC, Synovial: 6543 /mm3 — ABNORMAL HIGH (ref 0–200)

## 2021-07-23 IMAGING — US US EXTREM LOW VENOUS*L*
1 series · 13 of 24 positions shown · non-contrast
Comparison: None.

CLINICAL DATA: Left calf pain, knee effusion,



[Series 1: us extrem low venous*left* · 0.06mm/px · 33 acquisitions, 13 frames shown]
[im 1/33]
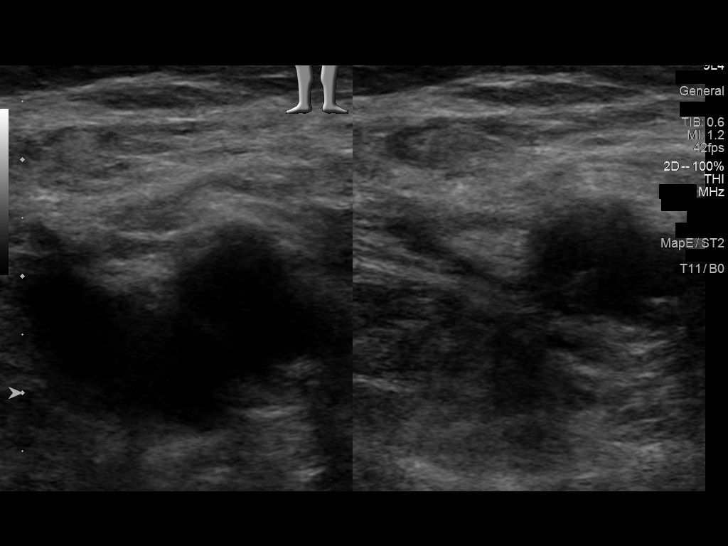
[im 3/33]
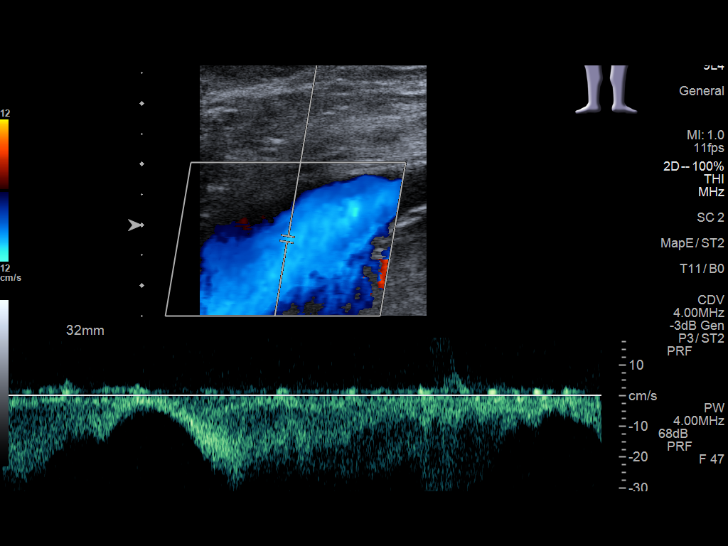
[im 6/33]
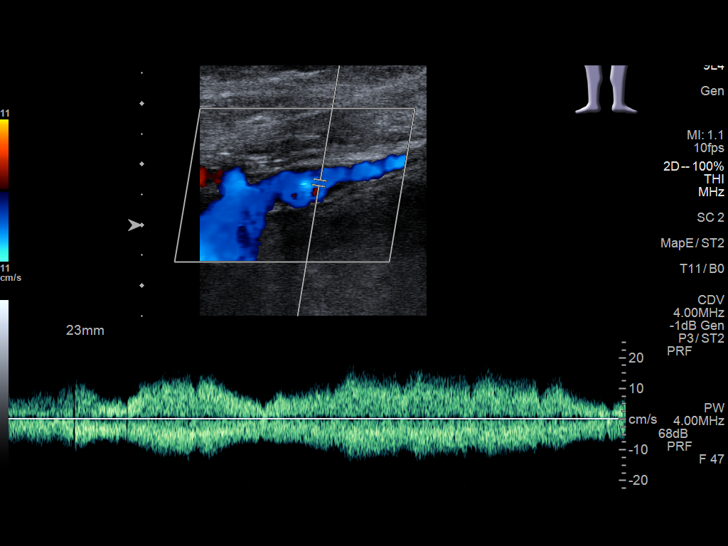
[im 9/33]
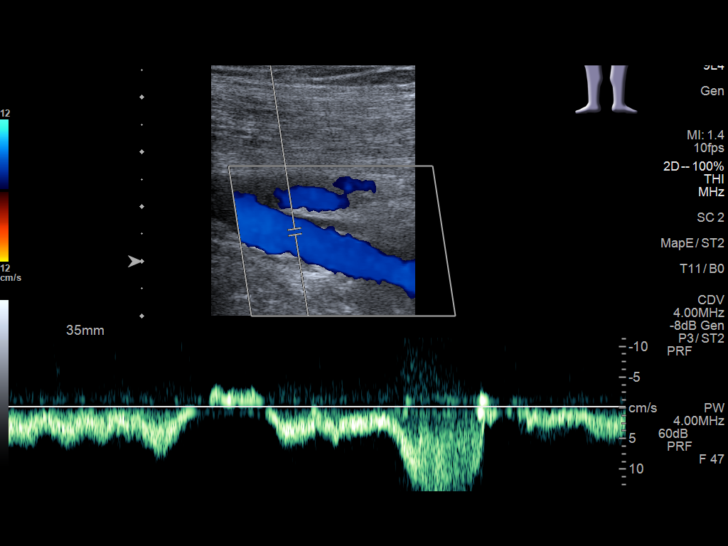
[im 12/33]
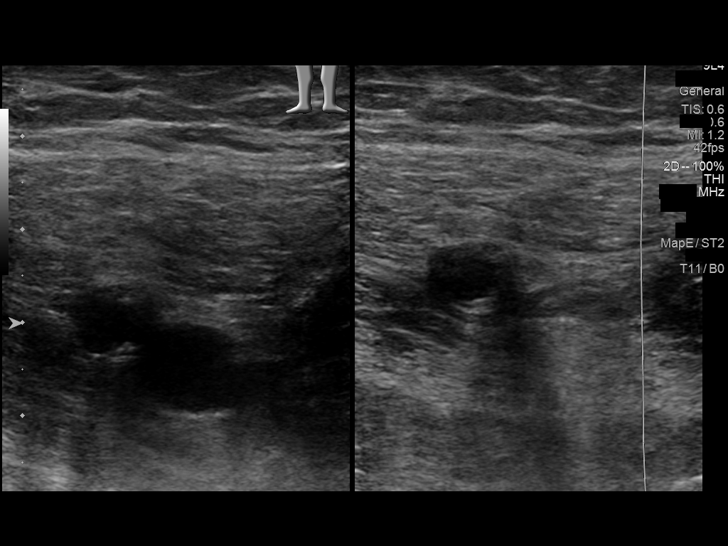
[im 14/33]
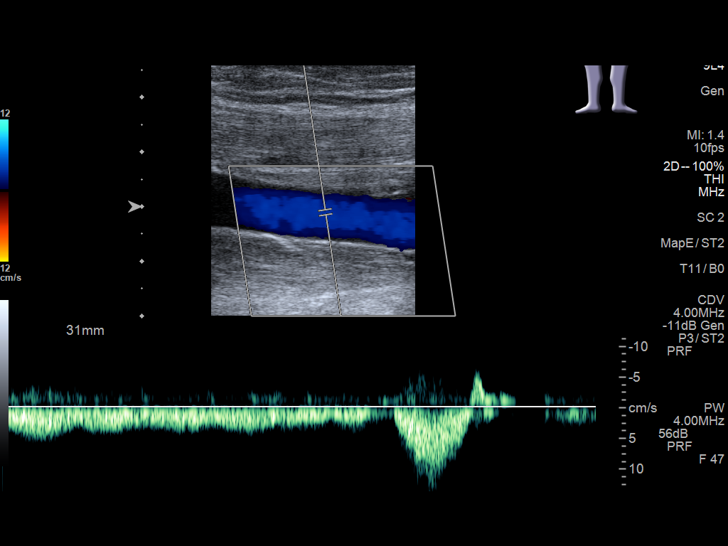
[im 19/33]
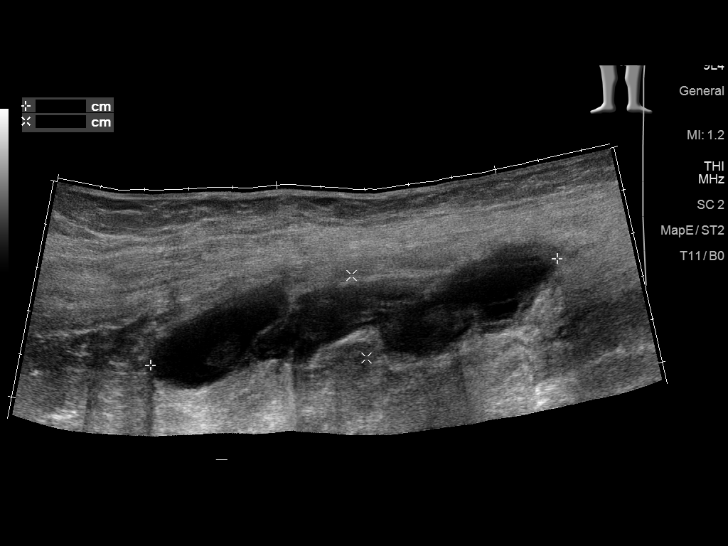
[im 20/33]
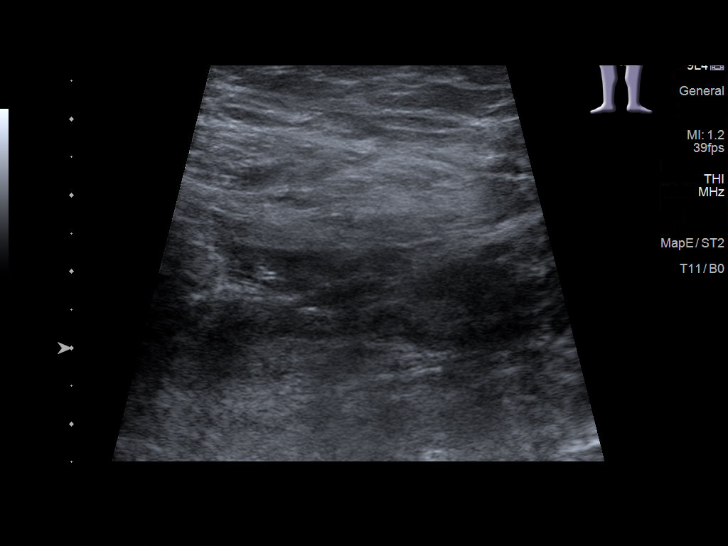
[im 21/33]
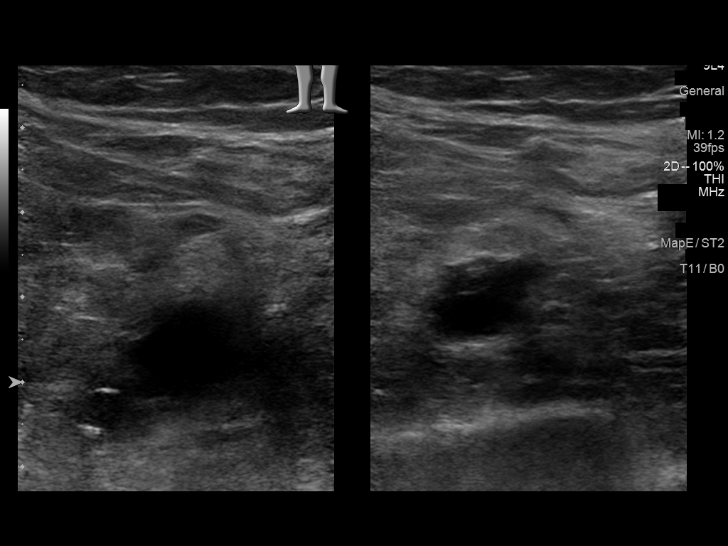
[im 24/33]
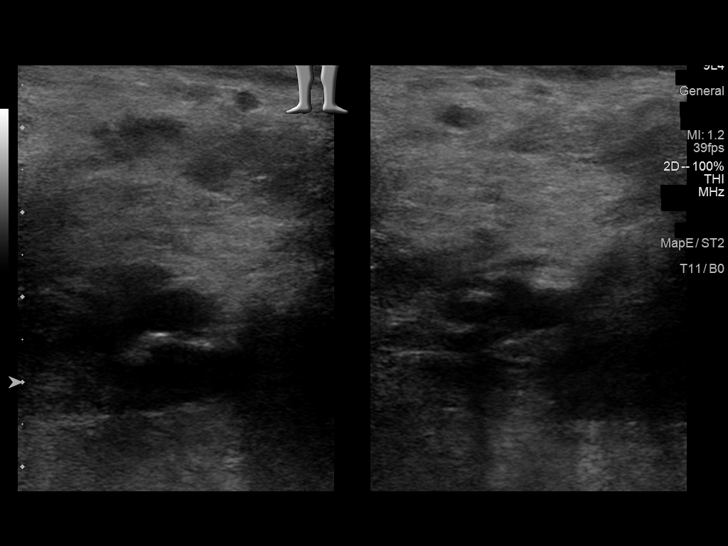
[im 27/33]
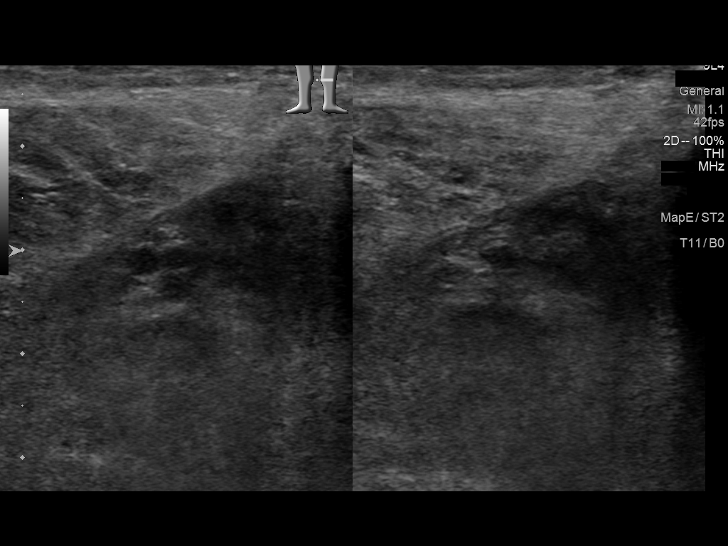
[im 30/33]
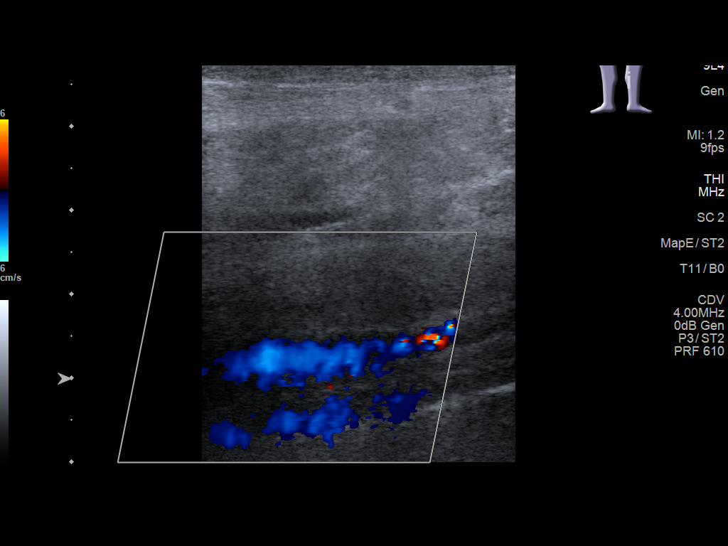
[im 33/33]
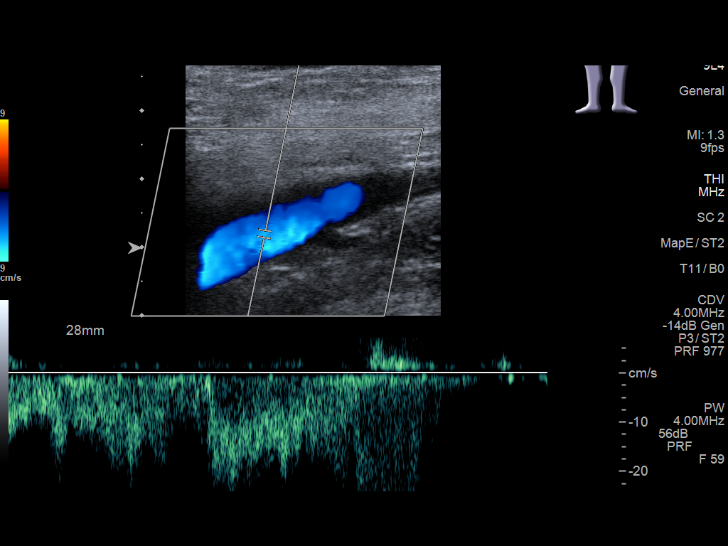

[13 of 24 positions shown; findings below may reference images not displayed]

FINDINGS: Contralateral Common Femoral Vein: Respiratory phasicity is normal
and symmetric with the symptomatic side. No evidence of thrombus.
Normal compressibility.

Common Femoral Vein: No evidence of thrombus. Normal
compressibility, respiratory phasicity and response to augmentation.

Saphenofemoral Junction: No evidence of thrombus. Normal
compressibility and flow on color Doppler imaging.

Profunda Femoral Vein: No evidence of thrombus. Normal
compressibility and flow on color Doppler imaging.

Femoral Vein: No evidence of thrombus. Normal compressibility,
respiratory phasicity and response to augmentation.

Popliteal Vein: No evidence of thrombus. Normal compressibility,
respiratory phasicity and response to augmentation.

Calf Veins: No evidence of thrombus. Normal compressibility and flow
on color Doppler imaging.

Other Findings: Left popliteal fossa complex lobulated septated
Baker's cyst measures 9.5 cm in length and 2 cm in diameter.
IMPRESSION: Negative for left lower extremity DVT.

Chronic complex left popliteal fossa Baker's cyst.

## 2022-04-17 ENCOUNTER — Other Ambulatory Visit (INDEPENDENT_AMBULATORY_CARE_PROVIDER_SITE_OTHER): Payer: Self-pay | Admitting: Nurse Practitioner

## 2022-04-17 DIAGNOSIS — N186 End stage renal disease: Secondary | ICD-10-CM

## 2022-04-22 ENCOUNTER — Encounter (INDEPENDENT_AMBULATORY_CARE_PROVIDER_SITE_OTHER): Payer: Medicare Other

## 2022-04-22 ENCOUNTER — Ambulatory Visit (INDEPENDENT_AMBULATORY_CARE_PROVIDER_SITE_OTHER): Payer: Medicare Other | Admitting: Vascular Surgery

## 2022-06-23 ENCOUNTER — Encounter (INDEPENDENT_AMBULATORY_CARE_PROVIDER_SITE_OTHER): Payer: Self-pay

## 2022-11-14 ENCOUNTER — Emergency Department
Admission: EM | Admit: 2022-11-14 | Discharge: 2022-11-14 | Disposition: A | Discharge status: Home / Self Care | Payer: 59 | Attending: Emergency Medicine | Admitting: Emergency Medicine

## 2022-11-14 ENCOUNTER — Other Ambulatory Visit: Payer: Self-pay

## 2022-11-14 DIAGNOSIS — Z992 Dependence on renal dialysis: Secondary | ICD-10-CM | POA: Diagnosis not present

## 2022-11-14 DIAGNOSIS — N186 End stage renal disease: Secondary | ICD-10-CM | POA: Diagnosis not present

## 2022-11-14 DIAGNOSIS — R202 Paresthesia of skin: Secondary | ICD-10-CM | POA: Insufficient documentation

## 2022-11-14 LAB — CBC
HCT: 26.9 % — ABNORMAL LOW (ref 39.0–52.0)
Hemoglobin: 8.3 g/dL — ABNORMAL LOW (ref 13.0–17.0)
MCH: 28 pg (ref 26.0–34.0)
MCHC: 30.9 g/dL (ref 30.0–36.0)
MCV: 90.9 fL (ref 80.0–100.0)
Platelets: 202 10*3/uL (ref 150–400)
RBC: 2.96 MIL/uL — ABNORMAL LOW (ref 4.22–5.81)
RDW: 15.5 % (ref 11.5–15.5)
WBC: 7.6 10*3/uL (ref 4.0–10.5)
nRBC: 0 % (ref 0.0–0.2)

## 2022-11-14 LAB — COMPREHENSIVE METABOLIC PANEL
ALT: 54 U/L — ABNORMAL HIGH (ref 0–44)
AST: 45 U/L — ABNORMAL HIGH (ref 15–41)
Albumin: 3.2 g/dL — ABNORMAL LOW (ref 3.5–5.0)
Alkaline Phosphatase: 89 U/L (ref 38–126)
Anion gap: 13 (ref 5–15)
BUN: 55 mg/dL — ABNORMAL HIGH (ref 6–20)
CO2: 31 mmol/L (ref 22–32)
Calcium: 8.7 mg/dL — ABNORMAL LOW (ref 8.9–10.3)
Chloride: 96 mmol/L — ABNORMAL LOW (ref 98–111)
Creatinine, Ser: 8.68 mg/dL — ABNORMAL HIGH (ref 0.61–1.24)
GFR, Estimated: 7 mL/min — ABNORMAL LOW (ref >60)
Glucose, Bld: 120 mg/dL — ABNORMAL HIGH (ref 70–99)
Potassium: 3.7 mmol/L (ref 3.5–5.1)
Sodium: 140 mmol/L (ref 135–145)
Total Bilirubin: 0.6 mg/dL (ref 0.3–1.2)
Total Protein: 7 g/dL (ref 6.5–8.1)

## 2022-11-14 MED ORDER — PREGABALIN 25 MG PO CAPS: 25.0000 mg | ORAL_CAPSULE | Freq: Two times a day (BID) | ORAL | 0 refills | Status: DC

## 2022-11-14 MED ORDER — PREGABALIN 25 MG PO CAPS
25.0000 mg | ORAL_CAPSULE | ORAL | Status: AC
  Administered 2022-11-14: 25 mg via ORAL
  Filled 2022-11-14: qty 1

## 2022-11-14 NOTE — Discharge Instructions (Signed)
Please make an appointment with Hand Surgery (Dr. Jackqulyn Livings) for further evaluation of your persistent carpal tunnel symptoms.  Keep your upcoming appointment with neurology as well.

## 2022-11-14 NOTE — ED Triage Notes (Signed)
Pt here with bilateral hand numbness. Pt states this has been an ongoing issue since 2001 but the numbness is getting worse. Pt states he was supposed to have an appt with his neurologist but missed his appt. Pt ambulatory to triage.

## 2022-11-14 NOTE — ED Provider Notes (Signed)
[facility] Provider Note    Event Date/Time   First MD Initiated Contact with Patient 11/14/22 1204     (approximate)   History   Chief Complaint: Numbness (Since 2001, pt states it is bilateral pain and numbness./)   HPI  William HOFACKER Sr. is a 57 y.o. male who comes the ED complaining of paresthesias of bilateral hands that have been ongoing for several years.  No recent trauma.  No chest pain shortness of breath or fever.  No motor weakness.  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  Notes that he had previously been given gabapentin for this but stopped taking it due to side effects.  In January he was prescribed nortriptyline which he has been taking every night as prescribed without relief.  He had an appointment with neurology which has been rescheduled to Wednesday, 5 days from now.  Pain is worse in the morning, improves throughout the day.  Previously has had steroid injections in the wrist which seem to help temporarily.     Physical Exam   Triage Vital Signs: ED Triage Vitals  Enc Vitals Group     BP 11/14/22 1124 136/82     Pulse Rate 11/14/22 1124 87     Resp 11/14/22 1124 18     Temp 11/14/22 1125 97.9 F (36.6 C)     Temp Source 11/14/22 1125 Oral     SpO2 11/14/22 1124 99 %     Weight 11/14/22 1124 210 lb 15.7 oz (95.7 kg)     Height 11/14/22 1124 6\' 2"  (1.88 m)     Head Circumference --      Peak Flow --      Pain Score --      Pain Loc --      Pain Edu? --      Excl. in [place]? --     Most recent vital signs: Vitals:   11/14/22 1124 11/14/22 1125  BP: 136/82   Pulse: 87   Resp: 18   Temp:  97.9 F (36.6 C)  SpO2: 99%     General: Awake, no distress.  CV:  Good peripheral perfusion.  Normal radial pulses.  Strong thrill in the left forearm AV fistula Resp:  Normal effort.  Abd:  No distention.  Other:  Intact motor function.  No swelling or inflammatory skin  changes.  No distal ischemia..  No radicular pain with axial load of the C-spine   ED Results / Procedures / Treatments   Labs (all labs ordered are listed, but only abnormal results are displayed) Labs Reviewed  CBC - Abnormal; Notable for the following components:      Result Value   RBC 2.96 (*)    Hemoglobin 8.3 (*)    HCT 26.9 (*)    All other components within normal limits  COMPREHENSIVE METABOLIC PANEL - Abnormal; Notable for the following components:   Chloride 96 (*)    Glucose, Bld 120 (*)    BUN 55 (*)    Creatinine, Ser 8.68 (*)    Calcium 8.7 (*)    Albumin 3.2 (*)    AST 45 (*)    ALT 54 (*)    GFR, Estimated 7 (*)    All other components within normal limits     EKG    RADIOLOGY    PROCEDURES:  Procedures   MEDICATIONS ORDERED IN ED: Medications  pregabalin (  LYRICA) capsule 25 mg (has no administration in time range)     IMPRESSION / MDM / ASSESSMENT AND PLAN / ED COURSE  I reviewed the triage vital signs and the nursing notes.  DDx: Carpal tunnel, peripheral neuropathy, chronic pain syndrome, electrolyte abnormality  Patient's presentation is most consistent with acute presentation with potential threat to life or bodily function.  Patient presents with bilateral hand paresthesias which have been chronic, ongoing for several years.  He has previously had some improvement with treatment for carpal tunnel syndrome.  He has an appointment with neurology in 5 days.  Encouraged him to keep this appointment.  Will add on low-dose Lyrica in the meantime to see if this can help.  Also provided referral information for hand surgery.       FINAL CLINICAL IMPRESSION(S) / ED DIAGNOSES   Final diagnoses:  Paresthesia  ESRD on hemodialysis ([place])     Rx / DC Orders   ED Discharge Orders          Ordered    pregabalin (LYRICA) 25 MG capsule  2 times daily        11/14/22 1234             Note:  This document was prepared using Dragon  voice recognition software and may include unintentional dictation errors.   Carrie Mew, MD 11/14/22 [phone]

## 2023-02-04 ENCOUNTER — Emergency Department: Payer: 59

## 2023-02-04 ENCOUNTER — Other Ambulatory Visit: Payer: Self-pay

## 2023-02-04 ENCOUNTER — Emergency Department
Admission: EM | Admit: 2023-02-04 | Discharge: 2023-02-04 | Disposition: A | Discharge status: Home / Self Care | Payer: 59 | Attending: Emergency Medicine | Admitting: Emergency Medicine

## 2023-02-04 DIAGNOSIS — M25522 Pain in left elbow: Secondary | ICD-10-CM | POA: Diagnosis not present

## 2023-02-04 DIAGNOSIS — Z992 Dependence on renal dialysis: Secondary | ICD-10-CM | POA: Diagnosis not present

## 2023-02-04 DIAGNOSIS — N186 End stage renal disease: Secondary | ICD-10-CM | POA: Diagnosis not present

## 2023-02-04 DIAGNOSIS — I120 Hypertensive chronic kidney disease with stage 5 chronic kidney disease or end stage renal disease: Secondary | ICD-10-CM | POA: Insufficient documentation

## 2023-02-04 DIAGNOSIS — R0789 Other chest pain: Secondary | ICD-10-CM | POA: Insufficient documentation

## 2023-02-04 LAB — BASIC METABOLIC PANEL
Anion gap: 14 (ref 5–15)
BUN: 46 mg/dL — ABNORMAL HIGH (ref 6–20)
CO2: 30 mmol/L (ref 22–32)
Calcium: 7.8 mg/dL — ABNORMAL LOW (ref 8.9–10.3)
Chloride: 95 mmol/L — ABNORMAL LOW (ref 98–111)
Creatinine, Ser: 6.94 mg/dL — ABNORMAL HIGH (ref 0.61–1.24)
GFR, Estimated: 9 mL/min — ABNORMAL LOW (ref >60)
Glucose, Bld: 87 mg/dL (ref 70–99)
Potassium: 3.6 mmol/L (ref 3.5–5.1)
Sodium: 139 mmol/L (ref 135–145)

## 2023-02-04 LAB — CBC
HCT: 31.5 % — ABNORMAL LOW (ref 39.0–52.0)
Hemoglobin: 9.6 g/dL — ABNORMAL LOW (ref 13.0–17.0)
MCH: 27 pg (ref 26.0–34.0)
MCHC: 30.5 g/dL (ref 30.0–36.0)
MCV: 88.5 fL (ref 80.0–100.0)
Platelets: 260 10*3/uL (ref 150–400)
RBC: 3.56 MIL/uL — ABNORMAL LOW (ref 4.22–5.81)
RDW: 15.5 % (ref 11.5–15.5)
WBC: 7 10*3/uL (ref 4.0–10.5)
nRBC: 0 % (ref 0.0–0.2)

## 2023-02-04 LAB — TROPONIN I (HIGH SENSITIVITY)
Troponin I (High Sensitivity): 47 ng/L — ABNORMAL HIGH (ref <18)
Troponin I (High Sensitivity): 46 ng/L — ABNORMAL HIGH (ref <18)

## 2023-02-04 NOTE — Discharge Instructions (Addendum)
You were seen in the emergency room today for evaluation of your chest pain and elbow pain.  Fortunately your lab work, EKG, and x-William Hobbs were overall reassuring.  If you are continuing to have elbow pain, I have included information for follow-up with orthopedics.  Return to the ER for any new or worsening symptoms.

## 2023-02-04 NOTE — ED Triage Notes (Signed)
Pt to er, pt states that he was at dialysis today and he was having a dull chest pain.  States that he was sent here to be evaluated, states that he is also here for swelling in his L elbow,  pt states that he was moving is arm and hurt his elbow.  States that he is a MWF chest pain.  States that he sometimes gets chest pain at dialysis.

## 2023-02-04 NOTE — ED Provider Notes (Signed)
[facility] Provider Note    Event Date/Time   First MD Initiated Contact with Patient 02/04/23 1320     (approximate)   History   Chest Pain   HPI  William Hobbs Sr. is a 57 y.o. male with history of ESRD, HTN presenting to the emergency department for evaluation of chest pain and elbow pain.  Patient completed his dialysis session today.  Afterwards, he had some mild dull chest pain.  Reports that this has occurred with dialysis multiple times before but was sent here to be further evaluated.  He reports he is actually more concerned about some left elbow pain that he is having.  On Wednesday of last week, he was moving a heavy pallet at [facility] when he think he strained the area.  Did not sustain any trauma directly to the area.  He is concerned about a ligament injury.  Does notice some swelling of the area.  Currently denies chest pain, shortness of breath, numbness, tingling.    Physical Exam   Triage Vital Signs: ED Triage Vitals  Enc Vitals Group     BP 02/04/23 1123 (!) 142/73     Pulse Rate 02/04/23 1123 80     Resp 02/04/23 1123 18     Temp 02/04/23 1123 98.4 F (36.9 C)     Temp Source 02/04/23 1123 Oral     SpO2 02/04/23 1123 97 %     Weight 02/04/23 1124 210 lb (95.3 kg)     Height 02/04/23 1124 6\' 2"  (1.88 m)     Head Circumference --      Peak Flow --      Pain Score 02/04/23 1124 3     Pain Loc --      Pain Edu? --      Excl. in [place]? --     Most recent vital signs: Vitals:   02/04/23 1123 02/04/23 1529  BP: (!) 142/73 (!) 167/88  Pulse: 80 81  Resp: 18 16  Temp: 98.4 F (36.9 C)   SpO2: 97% 100%     General: Awake, interactive  CV:  Regular rate, good peripheral perfusion, left upper extremity fistula in place with palpable thrill without overlying warmth or appreciable erythema Resp:  Lungs mildly coarse, unlabored respirations.  Abd:  Soft, nondistended.  Neuro:  Symmetric facial movement, fluid speech MSK:  Full  range of motion of the bilateral upper extremities, but patient does report some mild tenderness to palpation along the posterior aspect of the elbow without palpable deformity.  No open areas of skin.  There is some swelling around the elbow without obvious joint effusion.   ED Results / Procedures / Treatments   Labs (all labs ordered are listed, but only abnormal results are displayed) Labs Reviewed  BASIC METABOLIC PANEL - Abnormal; Notable for the following components:      Result Value   Chloride 95 (*)    BUN 46 (*)    Creatinine, Ser 6.94 (*)    Calcium 7.8 (*)    GFR, Estimated 9 (*)    All other components within normal limits  CBC - Abnormal; Notable for the following components:   RBC 3.56 (*)    Hemoglobin 9.6 (*)    HCT 31.5 (*)    All other components within normal limits  TROPONIN I (HIGH SENSITIVITY) - Abnormal; Notable for the following components:   Troponin I (High Sensitivity) 47 (*)    All other components within  normal limits  TROPONIN I (HIGH SENSITIVITY) - Abnormal; Notable for the following components:   Troponin I (High Sensitivity) 46 (*)    All other components within normal limits     EKG EKG independently reviewed interpreted by myself (ER attending) demonstrates:  EKG demonstrates normal sinus rhythm rate 73, PR 158, QRS 104, QTc 495, no acute ST changes  RADIOLOGY Imaging independently reviewed and interpreted by myself demonstrates:  CXR without focal Monia  PROCEDURES:  Critical Care performed: No  Procedures   MEDICATIONS ORDERED IN ED: Medications - No data to display   IMPRESSION / MDM / ASSESSMENT AND PLAN / ED COURSE  I reviewed the triage vital signs and the nursing notes.  Differential diagnosis includes, but is not limited to, ACS, pneumonia, pneumothorax, low suspicion PE, aortic dissection based on complete clinical improvement and clinical history.  Regarding elbow pain, suspect musculoskeletal strain with possible  ligamentous injury.  Consideration for fracture  Patient's presentation is most consistent with acute presentation with potential threat to life or bodily function.  57 year old male presenting with chest pain and elbow pain.  Regarding chest pain, does have risk factors, but reports that this is similar not new for him and has completely resolved.  Initial lab work with slightly elevated troponin at 47 similar to priors in our system from 1 year ago.  EKG without acute ischemic findings.  Will obtain repeat troponin to ensure no significant change.    Regarding elbow pain, I did discuss obtaining an x-Kierstin January, but patient adamant that he has not had any direct trauma to this area and declines this.  I did discuss that an x-Joelly Bolanos would not likely demonstrate a ligamentous injury and did recommend orthopedic follow-up for this.  Patient expressed understanding and is comfortable with this plan.  He is able to fully range the elbow passively and with no significant discomfort with active range of motion, clinical presentation is not consistent with a septic arthritis.   Repeat troponin remains stable at 46.  Very low suspicion ACS.  Patient without new complaints on reevaluation.  He is comfortable discharge home.  Strict return precautions provided.  Patient discharged in stable condition.     FINAL CLINICAL IMPRESSION(S) / ED DIAGNOSES   Final diagnoses:  Atypical chest pain  Left elbow pain     Rx / DC Orders   ED Discharge Orders     None        Note:  This document was prepared using Dragon voice recognition software and may include unintentional dictation errors.   [name], MD 02/04/23 Barry Brunner

## 2023-02-05 ENCOUNTER — Emergency Department
Admission: EM | Admit: 2023-02-05 | Discharge: 2023-02-05 | Disposition: A | Discharge status: Home / Self Care | Payer: 59 | Attending: Emergency Medicine | Admitting: Emergency Medicine

## 2023-02-05 ENCOUNTER — Encounter: Payer: Self-pay | Admitting: Emergency Medicine

## 2023-02-05 ENCOUNTER — Other Ambulatory Visit: Payer: Self-pay

## 2023-02-05 DIAGNOSIS — M25522 Pain in left elbow: Secondary | ICD-10-CM | POA: Diagnosis present

## 2023-02-05 DIAGNOSIS — G5622 Lesion of ulnar nerve, left upper limb: Secondary | ICD-10-CM | POA: Diagnosis not present

## 2023-02-05 NOTE — ED Provider Notes (Signed)
[facility] Provider Note  Patient Contact: 4:51 PM (approximate)   History   Arm Pain   HPI  William Hobbs Sr. is a 57 y.o. male presents to the emergency department with left elbow pain and tingling in the left fourth and fifth digits.  Patient was seen and evaluated for left elbow pain yesterday and was given orthopedic follow-up.  Patient reports that he did not have the tingling in his fingers yesterday and became concerned.  He reports his initial injury occurred when he was pulling a pallet.      Physical Exam   Triage Vital Signs: ED Triage Vitals  Enc Vitals Group     BP 02/05/23 1606 (!) 145/83     Pulse Rate 02/05/23 1606 74     Resp 02/05/23 1606 18     Temp 02/05/23 1606 98.2 F (36.8 C)     Temp src --      SpO2 02/05/23 1606 99 %     Weight 02/05/23 1605 209 lb 7 oz (95 kg)     Height 02/05/23 1605 6\' 2"  (1.88 m)     Head Circumference --      Peak Flow --      Pain Score 02/05/23 1605 10     Pain Loc --      Pain Edu? --      Excl. in [place]? --     Most recent vital signs: Vitals:   02/05/23 1606  BP: (!) 145/83  Pulse: 74  Resp: 18  Temp: 98.2 F (36.8 C)  SpO2: 99%     General: Alert and in no acute distress. Eyes:  PERRL. EOMI. Head: No acute traumatic findings ENT:      Nose: No congestion/rhinnorhea.      Mouth/Throat: Mucous membranes are moist. Neck: No stridor. No cervical spine tenderness to palpation. Cardiovascular:  Good peripheral perfusion Respiratory: Normal respiratory effort without tachypnea or retractions. Lungs CTAB. Good air entry to the bases with no decreased or absent breath sounds. Gastrointestinal: Bowel sounds 4 quadrants. Soft and nontender to palpation. No guarding or rigidity. No palpable masses. No distention. No CVA tenderness. Musculoskeletal: Patient performs full range of motion at the left elbow. Neurologic:  No gross focal neurologic deficits are appreciated.  Skin:   No rash  noted    ED Results / Procedures / Treatments   Labs (all labs ordered are listed, but only abnormal results are displayed) Labs Reviewed - No data to display    PROCEDURES:  Critical Care performed: No  Procedures   MEDICATIONS ORDERED IN ED: Medications - No data to display   IMPRESSION / MDM / ASSESSMENT AND PLAN / ED COURSE  I reviewed the triage vital signs and the nursing notes.                              Assessment and plan Cubital tunnel syndrome 57 year old male presents to the emergency department with left elbow pain consistent with cubital tunnel syndrome.  I recommended wrapping a towel around arm at night before patient goes to bed to keep the elbow fairly extended while sleeping.  Also recommended application of ice.  Patient is limited in terms of using anti-inflammatories given dialysis usage.  Patient feels comfortable with this conservative plan and understands that he needs to follow-up with orthopedics.   FINAL CLINICAL IMPRESSION(S) / ED DIAGNOSES   Final diagnoses:  Cubital  tunnel syndrome on left     Rx / DC Orders   ED Discharge Orders     None        Note:  This document was prepared using Dragon voice recognition software and may include unintentional dictation errors.   Pia Mau [place], PA-C 02/05/23 1654    Jene Every, MD 02/05/23 1655

## 2023-02-05 NOTE — ED Triage Notes (Signed)
Pt to ED for left arm pain since last Wednesday. States was at work and felt like pulled something Was seen yesterday

## 2023-02-19 ENCOUNTER — Emergency Department: Payer: 59

## 2023-02-19 ENCOUNTER — Emergency Department
Admission: EM | Admit: 2023-02-19 | Discharge: 2023-02-19 | Disposition: A | Discharge status: Home / Self Care | Payer: 59 | Attending: Emergency Medicine | Admitting: Emergency Medicine

## 2023-02-19 ENCOUNTER — Encounter: Payer: Self-pay | Admitting: Emergency Medicine

## 2023-02-19 ENCOUNTER — Other Ambulatory Visit: Payer: Self-pay

## 2023-02-19 DIAGNOSIS — N186 End stage renal disease: Secondary | ICD-10-CM | POA: Diagnosis not present

## 2023-02-19 DIAGNOSIS — R079 Chest pain, unspecified: Secondary | ICD-10-CM | POA: Diagnosis present

## 2023-02-19 DIAGNOSIS — Z992 Dependence on renal dialysis: Secondary | ICD-10-CM | POA: Insufficient documentation

## 2023-02-19 DIAGNOSIS — I120 Hypertensive chronic kidney disease with stage 5 chronic kidney disease or end stage renal disease: Secondary | ICD-10-CM | POA: Diagnosis not present

## 2023-02-19 DIAGNOSIS — R0789 Other chest pain: Secondary | ICD-10-CM | POA: Insufficient documentation

## 2023-02-19 DIAGNOSIS — M79602 Pain in left arm: Secondary | ICD-10-CM | POA: Diagnosis not present

## 2023-02-19 LAB — CBC
HCT: 32.1 % — ABNORMAL LOW (ref 39.0–52.0)
Hemoglobin: 9.9 g/dL — ABNORMAL LOW (ref 13.0–17.0)
MCH: 26.6 pg (ref 26.0–34.0)
MCHC: 30.8 g/dL (ref 30.0–36.0)
MCV: 86.3 fL (ref 80.0–100.0)
Platelets: 246 10*3/uL (ref 150–400)
RBC: 3.72 MIL/uL — ABNORMAL LOW (ref 4.22–5.81)
RDW: 15.8 % — ABNORMAL HIGH (ref 11.5–15.5)
WBC: 7.6 10*3/uL (ref 4.0–10.5)
nRBC: 0 % (ref 0.0–0.2)

## 2023-02-19 LAB — COMPREHENSIVE METABOLIC PANEL
ALT: 18 U/L (ref 0–44)
AST: 18 U/L (ref 15–41)
Albumin: 3.8 g/dL (ref 3.5–5.0)
Alkaline Phosphatase: 86 U/L (ref 38–126)
Anion gap: 19 — ABNORMAL HIGH (ref 5–15)
BUN: 101 mg/dL — ABNORMAL HIGH (ref 6–20)
CO2: 25 mmol/L (ref 22–32)
Calcium: 8.7 mg/dL — ABNORMAL LOW (ref 8.9–10.3)
Chloride: 93 mmol/L — ABNORMAL LOW (ref 98–111)
Creatinine, Ser: 13.98 mg/dL — ABNORMAL HIGH (ref 0.61–1.24)
GFR, Estimated: 4 mL/min — ABNORMAL LOW (ref >60)
Glucose, Bld: 113 mg/dL — ABNORMAL HIGH (ref 70–99)
Potassium: 4.1 mmol/L (ref 3.5–5.1)
Sodium: 137 mmol/L (ref 135–145)
Total Bilirubin: 0.7 mg/dL (ref 0.3–1.2)
Total Protein: 7.7 g/dL (ref 6.5–8.1)

## 2023-02-19 LAB — TROPONIN I (HIGH SENSITIVITY)
Troponin I (High Sensitivity): 72 ng/L — ABNORMAL HIGH (ref <18)
Troponin I (High Sensitivity): 67 ng/L — ABNORMAL HIGH (ref <18)

## 2023-02-19 MED ORDER — OXYCODONE-ACETAMINOPHEN 5-325 MG PO TABS
1.0000 | ORAL_TABLET | Freq: Once | ORAL | Status: AC
  Administered 2023-02-19: 1 via ORAL
  Filled 2023-02-19: qty 1

## 2023-02-19 MED ORDER — OXYCODONE HCL 5 MG PO TABS: 5.0000 mg | ORAL_TABLET | Freq: Four times a day (QID) | ORAL | 0 refills | Status: AC | PRN

## 2023-02-19 NOTE — ED Provider Notes (Signed)
[facility] Provider Note    Event Date/Time   First MD Initiated Contact with Patient 02/19/23 [phone]     (approximate)   History   Chest Pain   HPI  William DETTY Sr. is a 57 y.o. male with history of HTN, ESRD presenting to the emergency department for ongoing arm and chest pain.  He is actually seen by myself a couple weeks ago for the same.  At that time, stable troponin elevation, otherwise reassuring workup.  He was discharged with plans for outpatient follow-up.  Patient reports symptoms were initially tolerable, but over the past few days he has had worsening arm pain and feels that this is now radiating into his chest.  Described as dull, achy.  Symptoms initially occurred after lifting a heavy pallet, otherwise no trauma.  Did not go to dialysis yesterday as he was having pain, does have dialysis scheduled for tomorrow.  No fevers.  Is able to still move his elbow.     Physical Exam   Triage Vital Signs: ED Triage Vitals  Enc Vitals Group     BP 02/19/23 0629 (!) 177/81     Pulse Rate 02/19/23 0629 72     Resp 02/19/23 0629 18     Temp 02/19/23 0629 97.6 F (36.4 C)     Temp Source 02/19/23 0629 Oral     SpO2 02/19/23 0629 100 %     Weight 02/19/23 0617 210 lb (95.3 kg)     Height 02/19/23 0617 6\' 2"  (1.88 m)     Head Circumference --      Peak Flow --      Pain Score 02/19/23 0617 10     Pain Loc --      Pain Edu? --      Excl. in GC? --     Most recent vital signs: Vitals:   02/19/23 1200 02/19/23 1331  BP: (!) 193/101 (!) 190/94  Pulse: 69 75  Resp: 14 12  Temp:  97.8 F (36.6 C)  SpO2: 98% 100%     General: Awake, interactive  CV:  Regular rate, good peripheral perfusion.  Resp:  Unlabored respirations Abd:  Soft, nondistended.  Neuro:  Symmetric facial movement, fluid speech MSK:  There is a left upper extremity fistula with palpable thrill.  There is tenderness primarily along the medial aspect of the elbow.  There is  no significant joint effusion on palpation.  Patient is able to range the joint and does so spontaneously.  No appreciable overlying skin changes.   ED Results / Procedures / Treatments   Labs (all labs ordered are listed, but only abnormal results are displayed) Labs Reviewed  CBC - Abnormal; Notable for the following components:      Result Value   RBC 3.72 (*)    Hemoglobin 9.9 (*)    HCT 32.1 (*)    RDW 15.8 (*)    All other components within normal limits  COMPREHENSIVE METABOLIC PANEL - Abnormal; Notable for the following components:   Chloride 93 (*)    Glucose, Bld 113 (*)    BUN 101 (*)    Creatinine, Ser 13.98 (*)    Calcium 8.7 (*)    GFR, Estimated 4 (*)    Anion gap 19 (*)    All other components within normal limits  TROPONIN I (HIGH SENSITIVITY) - Abnormal; Notable for the following components:   Troponin I (High Sensitivity) 72 (*)    All  other components within normal limits  TROPONIN I (HIGH SENSITIVITY) - Abnormal; Notable for the following components:   Troponin I (High Sensitivity) 67 (*)    All other components within normal limits     EKG EKG independently reviewed interpreted by myself (ER attending) demonstrates:  EKG demonstrates normal sinus rhythm at a rate of 72, PR 168, QRS 100, QTc 431, no acute ST changes  RADIOLOGY Imaging independently reviewed and interpreted by myself demonstrates:  CXR with mild edema, suspect due to missed dialysis Elbow x-[place] without acute fracture or significant effusion [place] without evidence of DVT  PROCEDURES:  Critical Care performed: No  Procedures   MEDICATIONS ORDERED IN ED: Medications  oxyCODONE-acetaminophen (PERCOCET/ROXICET) 5-325 MG per tablet 1 tablet (1 tablet Oral Given 02/19/23 0833)  oxyCODONE-acetaminophen (PERCOCET/ROXICET) 5-325 MG per tablet 1 tablet (1 tablet Oral Given 02/19/23 1212)     IMPRESSION / MDM / ASSESSMENT AND PLAN / ED COURSE  I reviewed the triage vital signs and the  nursing notes.  Differential diagnosis includes, but is not limited to, musculoskeletal strain, DVT, much lower suspicion ACS based on clinical history, pneumonia  Patient's presentation is most consistent with acute presentation with potential threat to life or bodily function.  57 year old male presenting with ongoing chest and arm pain.  Labs from triage notable for elevated troponin at 72.Does appear the patient has chronic troponin elevation from 40s to 50s, suspect slight increase in troponin may be related to impaired clearance in the setting of missed dialysis.  However, will obtain repeat to ensure that it is not significantly changing.  Labs otherwise demonstrate stable anemia, elevated BUN and creatinine consistent with known history of ESRD, but fortunately normal potassium of 4.1.  Will obtain ultrasound of the left upper extremity, trial pain control, and x-L[place] Ekstrand for further evaluation.   Ultrasound without DVT.  Patient did have improvement in his pain after Percocet.  X-Gagan Dillion without acute bony abnormality.  Repeat troponin slightly downtrending.  Overall low suspicion for ACS.  Results reviewed with patient.  He does have outpatient follow-up with orthopedics scheduled.  Do think he is stable for discharge with outpatient follow-up.  Will DC with short course of pain medicine.  Strict return precautions provided.      FINAL CLINICAL IMPRESSION(S) / ED DIAGNOSES   Final diagnoses:  Left arm pain  Atypical chest pain     Rx / DC Orders   ED Discharge Orders          Ordered    oxyCODONE (ROXICODONE) 5 MG immediate release tablet  Every 6 hours PRN        02/19/23 1255             Note:  This document was prepared using Dragon voice recognition software and may include unintentional dictation errors.   [name], MD 02/19/23 [phone]

## 2023-02-19 NOTE — ED Triage Notes (Signed)
Patient ambulatory to triage with steady gait, without difficulty or distress noted; pt reports left sided CP and left arm pain since last night; denies any accomp symptoms; dialysis due tomorrow

## 2023-02-19 NOTE — ED Notes (Signed)
Elana RN to bedside due to patients call light being on. Patient requesting graham crackers. Elana RN notified MD patient's BP was still high. MD to address. Patient given graham crackers. Denies any further needs at this time. NAD.

## 2023-02-19 NOTE — ED Notes (Signed)
Patients BP remains high. MD Ray aware.

## 2023-02-19 NOTE — Discharge Instructions (Addendum)
You were seen in the ER today for your continued left arm pain and chest pain.  Fortunately your testing here did not show an emergency cause for this.  Your ultrasound did not show signs of a blood clot.  Please keep your scheduled follow-up with orthopedics for further evaluation of your arm pain.  I sent a short course of pain medicine to your pharmacy that you can take as needed.  This does make you drowsy, do not drive or operate machinery when taking this.  Return to the ER for new or worsening symptoms.

## 2023-09-21 ENCOUNTER — Other Ambulatory Visit (INDEPENDENT_AMBULATORY_CARE_PROVIDER_SITE_OTHER): Payer: Self-pay | Admitting: Nurse Practitioner

## 2023-09-21 ENCOUNTER — Encounter (INDEPENDENT_AMBULATORY_CARE_PROVIDER_SITE_OTHER): Payer: Self-pay | Admitting: Nurse Practitioner

## 2023-09-21 DIAGNOSIS — N186 End stage renal disease: Secondary | ICD-10-CM

## 2023-09-24 ENCOUNTER — Ambulatory Visit (INDEPENDENT_AMBULATORY_CARE_PROVIDER_SITE_OTHER): Payer: 59

## 2023-09-24 ENCOUNTER — Encounter (INDEPENDENT_AMBULATORY_CARE_PROVIDER_SITE_OTHER): Payer: Self-pay | Admitting: Nurse Practitioner

## 2023-09-24 ENCOUNTER — Ambulatory Visit (INDEPENDENT_AMBULATORY_CARE_PROVIDER_SITE_OTHER): Payer: 59 | Admitting: Nurse Practitioner

## 2023-09-24 VITALS — BP 145/82 | HR 71 | Resp 16 | Wt 209.3 lb

## 2023-09-24 DIAGNOSIS — N186 End stage renal disease: Secondary | ICD-10-CM | POA: Diagnosis not present

## 2023-09-24 DIAGNOSIS — I10 Essential (primary) hypertension: Secondary | ICD-10-CM | POA: Diagnosis not present

## 2023-09-24 DIAGNOSIS — Z992 Dependence on renal dialysis: Secondary | ICD-10-CM

## 2023-09-24 NOTE — Progress Notes (Signed)
Subjective:    Patient ID: William Kocher., male    DOB: 05/07/1966, 58 y.o.   MRN: [id number] Chief Complaint  Patient presents with   Follow-up    Ref [facility] consult with HDA for pain in shoulder    The patient is a 58 year old male who presents today for evaluation of pain in his left shoulder.  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  He was last seen in our office in 2022.  Today he has a flow volume of 1839 with no significant areas of stenosis.  This is fairly consistent with his previous flow volume of 2222.  He also has an aneurysmal fistula but this is consistent with his previous studies as well.  He has no evidence of skin threatening today.  A larger issue is with his shoulder.  He notes that it has an aching sensation at times that happens near the end of his dialysis treatments.  It is not consistent.  He notes that he has not had this issue since the referral was sent about a month or so ago.  It was not until today that the pain was elicited due to his arm positioning for his ultrasound.  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    Review of Systems  Musculoskeletal:  Positive for arthralgias.  All other systems reviewed and are negative.      Objective:   Physical Exam Vitals reviewed.  HENT:     Head: Normocephalic.  Cardiovascular:     Rate and Rhythm: Normal rate.     Pulses:          Radial pulses are 2+ on the left side.     Arteriovenous access: Left arteriovenous access is present.    Comments: Good thrill and bruit Pulmonary:     Effort: Pulmonary effort is normal.  Skin:    General: Skin is warm and dry.  Neurological:     Mental Status: He is alert and oriented to person, place, and time.  Psychiatric:        Mood and Affect: Mood normal.        Behavior: Behavior normal.        Thought Content: Thought content normal.         Judgment: Judgment normal.     BP (!) 145/82   Pulse 71   Resp 16   Wt 209 lb 4.8 oz (94.9 kg)   BMI 26.87 kg/m   Past Medical History:  Diagnosis Date   Chronic kidney disease    Dialysis patient (HCC)    Hypertension     Social History   Socioeconomic History   Marital status: Married    Spouse name: Not on file   Number of children: Not on file   Years of education: Not on file   Highest education level: Not on file  Occupational History   Not on file  Tobacco Use   Smoking status: Every Day    Current packs/day: 0.25    Types: Cigarettes   Smokeless tobacco: Never   Tobacco comments:    1 pack every 3 days.  Vaping Use   Vaping status: Never Used  Substance and Sexual Activity   Alcohol use: No   Drug use: Yes    Types: Marijuana   Sexual activity: Not on file  Other Topics Concern   Not on file  Social History Narrative   Not on file   Social Drivers of Health   Financial Resource Strain: Not on file  Food Insecurity: Not on file  Transportation Needs: Not on file  Physical Activity: Not on file  Stress: Not on file  Social Connections: Not on file  Intimate Partner Violence: Not on file    Past Surgical History:  Procedure Laterality Date   APPENDECTOMY     age 86   AV FISTULA PLACEMENT      Family History  Problem Relation Age of Onset   Heart failure Father    Heart disease Maternal Grandmother    Heart disease Paternal Grandmother     No Known Allergies     Latest Ref Rng & Units 02/19/2023    6:31 AM 02/04/2023   11:30 AM 11/14/2022   11:26 AM  CBC  WBC 4.0 - 10.5 K/uL 7.6  7.0  7.6   Hemoglobin 13.0 - 17.0 g/dL 9.9  9.6  8.3   Hematocrit 39.0 - 52.0 % 32.1  31.5  26.9   Platelets 150 - 400 K/uL 246  260  202       CMP     Component Value Date/Time   NA 137 02/19/2023 0631   NA 142 12/20/2014 1212   K 4.1 02/19/2023 0631   K 3.5 12/20/2014 1212   CL 93 (L) 02/19/2023 0631   CL 98 (L) 12/20/2014 1212   CO2 25  02/19/2023 0631   CO2 32 12/20/2014 1212   GLUCOSE 113 (H) 02/19/2023 0631   GLUCOSE 76 12/20/2014 1212   BUN 101 (H) 02/19/2023 0631   BUN 19 12/20/2014 1212   CREATININE 13.98 (H) 02/19/2023 0631   CREATININE 7.30 (H) 12/20/2014 1212   CALCIUM 8.7 (L) 02/19/2023 0631   CALCIUM 8.9 12/20/2014 1212   PROT 7.7 02/19/2023 0631   PROT 7.1 12/20/2014 1212   ALBUMIN 3.8 02/19/2023 0631   ALBUMIN 3.7 12/20/2014 1212   AST 18 02/19/2023 0631   AST 14 (L) 12/20/2014 1212   ALT 18 02/19/2023 0631   ALT 12 (L) 12/20/2014 1212   ALKPHOS 86 02/19/2023 0631   ALKPHOS 66 12/20/2014 1212   BILITOT 0.7 02/19/2023 0631   BILITOT 0.3 12/20/2014 1212   GFRNONAA 4 (L) 02/19/2023 0631   GFRNONAA 8 (L) 12/20/2014 1212     No results found.     Assessment & Plan:   1. ESRD on hemodialysis (HCC) (Primary) Today the patient's noninvasive studies show that he has a fistula that is functioning well with no evidence of steal syndrome.  Based on patient's description of symptoms as well as the location it is unlikely that this would be related to steal syndrome.  I suspect that it is musculoskeletal issue as it seems to present more more depending on the patient's positioning.  In addition some of the symptoms started after he weaned himself off of Lyrica.  He is advised to restart his Lyrica to see if this helps with improvement and if not he may need to be evaluated by orthopedic surgery.  As far as his fistula he has had this for nearly 20 years.  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  Based on this we can continue to monitor every 6 months but no further intervention for it at this time.  2. Primary hypertension Continue antihypertensive medications as already  ordered, these medications have been reviewed and there are no changes at this time.   Current Outpatient Medications on File Prior to Visit  Medication Sig Dispense Refill   b  complex-vitamin c-folic acid (NEPHRO-VITE) 0.8 MG TABS tablet Take 1 tablet by mouth daily.     calcium acetate (PHOSLO) 667 MG capsule Take 1,334 mg by mouth 3 (three) times daily with meals.     fluticasone (FLONASE) 50 MCG/ACT nasal spray Place 2 sprays into both nostrils 2 (two) times daily. 1 g 0   hydrALAZINE (APRESOLINE) 100 MG tablet Take 100 mg by mouth 3 (three) times daily.     labetalol (NORMODYNE) 200 MG tablet Take 600 mg by mouth 2 (two) times daily.     minoxidil (LONITEN) 2.5 MG tablet Take 2.5 mg by mouth as needed.     nitroGLYCERIN (NITROSTAT) 0.3 MG SL tablet Place 0.3 mg under the tongue.     omeprazole (PRILOSEC) 20 MG capsule Take 20 mg by mouth as needed.     pregabalin (LYRICA) 25 MG capsule Take 1 capsule (25 mg total) by mouth 2 (two) times daily for 15 days. 30 capsule 0   sildenafil (VIAGRA) 100 MG tablet Take 100 mg by mouth daily as needed for erectile dysfunction.     lidocaine (LIDODERM) 5 % Place 1 patch onto the skin daily. Remove & Discard patch within 12 hours or as directed by MD (Patient not taking: Reported on 04/23/2021) 30 patch 0   predniSONE (DELTASONE) 20 MG tablet 3 tabs po qd x 2 days, then 2 tabs po qd x 3 days, then 1 tab po qd x 3 days, then half a tab po qd x 2 days (Patient not taking: Reported on 04/23/2021) 16 tablet 0   ranitidine (ZANTAC) 150 MG tablet Take 150 mg by mouth as needed. (Patient not taking: Reported on 04/23/2021)     No current facility-administered medications on file prior to visit.    There are no Patient Instructions on file for this visit. No follow-ups on file.   Georgiana Spinner, NP

## 2023-09-25 ENCOUNTER — Encounter (INDEPENDENT_AMBULATORY_CARE_PROVIDER_SITE_OTHER): Payer: Self-pay | Admitting: Nurse Practitioner

## 2023-12-16 ENCOUNTER — Other Ambulatory Visit
Admission: RE | Admit: 2023-12-16 | Discharge: 2023-12-16 | Disposition: A | Discharge status: Home / Self Care | Source: Ambulatory Visit | Attending: Nephrology | Admitting: Nephrology

## 2023-12-16 DIAGNOSIS — N186 End stage renal disease: Secondary | ICD-10-CM | POA: Insufficient documentation

## 2023-12-16 LAB — POTASSIUM: Potassium: 4.3 mmol/L (ref 3.5–5.1)

## 2024-01-11 ENCOUNTER — Ambulatory Visit: Payer: 59 | Attending: Cardiology | Admitting: Cardiology

## 2024-01-11 ENCOUNTER — Encounter: Payer: Self-pay | Admitting: Cardiology

## 2024-01-11 VITALS — BP 162/89 | HR 72 | Ht 74.0 in | Wt 202.4 lb

## 2024-01-11 DIAGNOSIS — I10 Essential (primary) hypertension: Secondary | ICD-10-CM

## 2024-01-11 DIAGNOSIS — R072 Precordial pain: Secondary | ICD-10-CM

## 2024-01-11 DIAGNOSIS — F17200 Nicotine dependence, unspecified, uncomplicated: Secondary | ICD-10-CM

## 2024-01-11 MED ORDER — LOSARTAN POTASSIUM 50 MG PO TABS: 50.0000 mg | ORAL_TABLET | Freq: Every day | ORAL | 3 refills | Status: DC

## 2024-01-11 NOTE — Patient Instructions (Signed)
 Medication Instructions:  Your physician recommends the following medication changes.  START TAKING: Losartan  50 mg *If you need a refill on your cardiac medications before your next appointment, please call your pharmacy*  Lab Work: No labs ordered today  If you have labs (blood work) drawn today and your tests are completely normal, you will receive your results only by: MyChart Message (if you have MyChart) OR A paper copy in the mail If you have any lab test that is abnormal or we need to change your treatment, we will call you to review the results.  Testing/Procedures: Your physician has requested that you have an echocardiogram. Echocardiography is a painless test that uses sound waves to create images of your heart. It provides your doctor with information about the size and shape of your heart and how well your heart's chambers and valves are working.   You may receive an ultrasound enhancing agent through an IV if needed to better visualize your heart during the echo. This procedure takes approximately one hour.  There are no restrictions for this procedure.  This will take place at [address] [facility]) [address], [place] [address]  Please note: We ask at that you not bring children with you during ultrasound (echo/ vascular) testing. Due to room size and safety concerns, children are not allowed in the ultrasound rooms during exams. Our front office staff cannot provide observation of children in our lobby area while testing is being conducted. An adult accompanying a patient to their appointment will only be allowed in the ultrasound room at the discretion of the ultrasound technician under special circumstances. We apologize for any inconvenience.   Your provider has ordered a Lexiscan/ Exercise Myoview Stress test. This will take place at [facility] Please report to the [facility] medical mall entrance. The volunteers at the first desk will direct you where to go.  [facility]  MYOVIEW  Your provider has ordered a Stress Test with nuclear imaging. The purpose of this test is to evaluate the blood supply to your heart muscle. This procedure is referred to as a "Non-Invasive Stress Test." This is because other than having an IV started in your vein, nothing is inserted or "invades" your body. Cardiac stress tests are done to find areas of poor blood flow to the heart by determining the extent of coronary artery disease (CAD). Some patients exercise on a treadmill, which naturally increases the blood flow to your heart, while others who are unable to walk on a treadmill due to physical limitations will have a pharmacologic/chemical stress agent called Lexiscan . This medicine will mimic walking on a treadmill by temporarily increasing your coronary blood flow.   Please note: these test may take anywhere between 2-4 hours to complete  How to prepare for your Myoview test:  Nothing to eat for 6 hours prior to the test No caffeine for 24 hours prior to test No smoking 24 hours prior to test. Your medication may be taken with water.  If your doctor stopped a medication because of this test, do not take that medication. Ladies, please do not wear dresses.  Skirts or pants are appropriate. Please wear a short sleeve shirt. No perfume, cologne or lotion. Wear comfortable walking shoes. No heels!   PLEASE NOTIFY THE OFFICE AT LEAST 24 HOURS IN ADVANCE IF YOU ARE UNABLE TO KEEP YOUR APPOINTMENT.  [phone] AND  PLEASE NOTIFY NUCLEAR MEDICINE AT [facility] AT LEAST 24 HOURS IN ADVANCE IF YOU ARE UNABLE TO  KEEP YOUR APPOINTMENT. [phone]   Follow-Up: At [facility], you and your health needs are our priority.  As part of our continuing mission to provide you with exceptional heart care, our providers are all part of one team.  This team includes your primary Cardiologist (physician) and Advanced Practice Providers or APPs (Physician Assistants and Nurse Practitioners)  who all work together to provide you with the care you need, when you need it.  Your next appointment:   3 month(s)  Provider:   You may see Dr. Junnie Olives or one of the following Advanced Practice Providers on your designated Care Team:   Laneta Pintos, NP Gildardo Labrador, PA-C Varney Gentleman, PA-C Cadence Naples, PA-C Ronald Cockayne, NP Morey Ar, NP    We recommend signing up for the patient portal called "MyChart".  Sign up information is provided on this After Visit Summary.  MyChart is used to connect with patients for Virtual Visits (Telemedicine).  Patients are able to view lab/test results, encounter notes, upcoming appointments, etc.  Non-urgent messages can be sent to your provider as well.   To learn more about what you can do with MyChart, go to [email or web address].

## 2024-01-11 NOTE — Progress Notes (Signed)
 Cardiology Office Note:    Date:  01/11/2024   ID:  William Daniel Sr., DOB 10-21-1965, MRN [id number]  PCP:  Cephus Collin, MD   [facility] Providers Cardiologist:  None     Referring MD: Cephus Collin, MD   Chief Complaint  Patient presents with   Follow-up    Had some chest pain a couple of days ago, medication reviewed verbally with patient     History of Present Illness:    William REESOR Sr. is a 58 y.o. male with a hx of hypertension, CKD on HD MWF, current smoker x 30+ years who presents due to chest pain and elevated BP.  States having symptoms of chest pain and shortness of breath ongoing over the past 2 to 3 months.  Recently placed on amlodipine about 3 months ago, patient stopped taking 1 month ago with improvement in symptoms of shortness of breath/breathlessness.  Has also been taking more fluid during dialysis sessions which may have helped with the symptoms.  Has occasional nonexertional chest pain over the past month or so.  His blood pressures at home are elevated with systolics in the 180s to 200s.  States his blood pressure usually goes down during dialysis sessions.  Has not taken BP meds today.  Past Medical History:  Diagnosis Date   Chronic kidney disease    Dialysis patient [facility])    Hypertension     Past Surgical History:  Procedure Laterality Date   APPENDECTOMY     age 23   AV FISTULA PLACEMENT      Current Medications: Current Meds  Medication Sig   b complex-vitamin c-folic acid  (NEPHRO-VITE) 0.8 MG TABS tablet Take 1 tablet by mouth daily.   calcium  acetate (PHOSLO ) 667 MG capsule Take 1,334 mg by mouth 3 (three) times daily with meals.   fluticasone  (FLONASE ) 50 MCG/ACT nasal spray Place 2 sprays into both nostrils 2 (two) times daily.   hydrALAZINE  (APRESOLINE ) 100 MG tablet Take 100 mg by mouth 3 (three) times daily.   labetalol  (NORMODYNE ) 200 MG tablet Take 600 mg by mouth 2 (two) times daily.   lidocaine  (LIDODERM ) 5 %  Place 1 patch onto the skin daily. Remove & Discard patch within 12 hours or as directed by MD   losartan  (COZAAR ) 50 MG tablet Take 1 tablet (50 mg total) by mouth daily.   omeprazole (PRILOSEC) 20 MG capsule Take 20 mg by mouth as needed.   predniSONE  (DELTASONE ) 20 MG tablet 3 tabs po qd x 2 days, then 2 tabs po qd x 3 days, then 1 tab po qd x 3 days, then half a tab po qd x 2 days   ranitidine (ZANTAC) 150 MG tablet Take 150 mg by mouth as needed.   sildenafil (VIAGRA) 100 MG tablet Take 100 mg by mouth daily as needed for erectile dysfunction.     Allergies:   Patient has no known allergies.   Social History   Socioeconomic History   Marital status: Married    Spouse name: Not on file   Number of children: Not on file   Years of education: Not on file   Highest education level: Not on file  Occupational History   Not on file  Tobacco Use   Smoking status: Every Day    Current packs/day: 0.25    Types: Cigarettes   Smokeless tobacco: Never   Tobacco comments:    1 pack every 3 days.  Vaping Use   Vaping  status: Never Used  Substance and Sexual Activity   Alcohol use: No   Drug use: Yes    Types: Marijuana   Sexual activity: Not on file  Other Topics Concern   Not on file  Social History Narrative   Not on file   Social Drivers of Health   Financial Resource Strain: Not on file  Food Insecurity: Not on file  Transportation Needs: Not on file  Physical Activity: Not on file  Stress: Not on file  Social Connections: Not on file     Family History: The patient's family history includes Heart disease in his maternal grandmother and paternal grandmother; Heart failure in his father.  ROS:   Please see the history of present illness.     All other systems reviewed and are negative.  EKGs/Labs/Other Studies Reviewed:    The following studies were reviewed today:  EKG Interpretation Date/Time:  Monday Jan 11 2024 14:35:14 EDT Ventricular Rate:  72 PR  Interval:  174 QRS Duration:  100 QT Interval:  442 QTC Calculation: 483 R Axis:   11  Text Interpretation: Normal sinus rhythm Prolonged QT Confirmed by William Hobbs ([id number]) on 01/11/2024 2:39:00 PM    Recent Labs: 02/19/2023: ALT 18; BUN 101; Creatinine, Ser 13.98; Hemoglobin 9.9; Platelets 246; Sodium 137 12/16/2023: Potassium 4.3  Recent Lipid Panel No results found for: "CHOL", "TRIG", "HDL", "CHOLHDL", "VLDL", "LDLCALC", "LDLDIRECT"   Risk Assessment/Calculations:         Physical Exam:    VS:  BP (!) 162/89 (BP Location: Left Arm, Patient Position: Sitting)   Pulse 72   Ht 6\' 2"  (1.88 m)   Wt 202 lb 6.4 oz (91.8 kg)   SpO2 99%   BMI 25.99 kg/m     Wt Readings from Last 3 Encounters:  01/11/24 202 lb 6.4 oz (91.8 kg)  09/24/23 209 lb 4.8 oz (94.9 kg)  02/19/23 210 lb (95.3 kg)     GEN:  Well nourished, well developed in no acute distress HEENT: Normal NECK: No JVD; No carotid bruits CARDIAC: RRR, no murmurs, rubs, gallops RESPIRATORY:  Clear to auscultation without rales, wheezing or rhonchi  ABDOMEN: Soft, non-tender, non-distended MUSCULOSKELETAL:  No edema; left forearm AV fistula noted SKIN: Warm and dry NEUROLOGIC:  Alert and oriented x 3 PSYCHIATRIC:  Normal affect   ASSESSMENT:    1. Precordial pain   2. Primary hypertension   3. Current smoker    PLAN:    In order of problems listed above:  Chest pain, several risk factors.  Get echo, get Lexiscan Myoview to evaluate ischemia. Hypertension, BP elevated.  Start losartan  50 mg daily, continue hydralazine  100 mg 3 times daily, labetalol  600 twice daily.  Titrate losartan  for adequate BP control. Current smoker, cessation advised.  Follow-up after cardiac testing.      Medication Adjustments/Labs and Tests Ordered: Current medicines are reviewed at length with the patient today.  Concerns regarding medicines are outlined above.  Orders Placed This Encounter  Procedures   NM Myocar Multi  W/Spect W/Wall Motion / EF   EKG 12-Lead   ECHOCARDIOGRAM COMPLETE   Meds ordered this encounter  Medications   losartan  (COZAAR ) 50 MG tablet    Sig: Take 1 tablet (50 mg total) by mouth daily.    Dispense:  90 tablet    Refill:  3    Patient Instructions  Medication Instructions:  Your physician recommends the following medication changes.  START TAKING: Losartan  50 mg *If you need  a refill on your cardiac medications before your next appointment, please call your pharmacy*  Lab Work: No labs ordered today  If you have labs (blood work) drawn today and your tests are completely normal, you will receive your results only by: MyChart Message (if you have MyChart) OR A paper copy in the mail If you have any lab test that is abnormal or we need to change your treatment, we will call you to review the results.  Testing/Procedures: Your physician has requested that you have an echocardiogram. Echocardiography is a painless test that uses sound waves to create images of your heart. It provides your doctor with information about the size and shape of your heart and how well your heart's chambers and valves are working.   You may receive an ultrasound enhancing agent through an IV if needed to better visualize your heart during the echo. This procedure takes approximately one hour.  There are no restrictions for this procedure.  This will take place at [address] [facility] [address], [place] [address]  Please note: We ask at that you not bring children with you during ultrasound (echo/ vascular) testing. Due to room size and safety concerns, children are not allowed in the ultrasound rooms during exams. Our front office staff cannot provide observation of children in our lobby area while testing is being conducted. An adult accompanying a patient to their appointment will only be allowed in the ultrasound room at the discretion of the ultrasound technician under special  circumstances. We apologize for any inconvenience.   Your provider has ordered a Lexiscan/ Exercise Myoview Stress test. This will take place at [facility]. Please report to the [facility] medical mall entrance. The volunteers at the first desk will direct you where to go.  [facility] MYOVIEW  Your provider has ordered a Stress Test with nuclear imaging. The purpose of this test is to evaluate the blood supply to your heart muscle. This procedure is referred to as a "Non-Invasive Stress Test." This is because other than having an IV started in your vein, nothing is inserted or "invades" your body. Cardiac stress tests are done to find areas of poor blood flow to the heart by determining the extent of coronary artery disease (CAD). Some patients exercise on a treadmill, which naturally increases the blood flow to your heart, while others who are unable to walk on a treadmill due to physical limitations will have a pharmacologic/chemical stress agent called Lexiscan . This medicine will mimic walking on a treadmill by temporarily increasing your coronary blood flow.   Please note: these test may take anywhere between 2-4 hours to complete  How to prepare for your Myoview test:  Nothing to eat for 6 hours prior to the test No caffeine for 24 hours prior to test No smoking 24 hours prior to test. Your medication may be taken with water.  If your doctor stopped a medication because of this test, do not take that medication. Ladies, please do not wear dresses.  Skirts or pants are appropriate. Please wear a short sleeve shirt. No perfume, cologne or lotion. Wear comfortable walking shoes. No heels!   PLEASE NOTIFY THE OFFICE AT LEAST 24 HOURS IN ADVANCE IF YOU ARE UNABLE TO KEEP YOUR APPOINTMENT.  [phone] AND  PLEASE NOTIFY NUCLEAR MEDICINE AT [facility] AT LEAST 24 HOURS IN ADVANCE IF YOU ARE UNABLE TO KEEP YOUR APPOINTMENT. [phone]   Follow-Up: At [facility], you and your health needs are our  priority.  As part of our continuing mission to provide you with exceptional heart care, our providers are all part of one team.  This team includes your primary Cardiologist (physician) and Advanced Practice Providers or APPs (Physician Assistants and Nurse Practitioners) who all work together to provide you with the care you need, when you need it.  Your next appointment:   3 month(s)  Provider:   You may see Dr. Junnie Olives or one of the following Advanced Practice Providers on your designated Care Team:   Laneta Pintos, NP Gildardo Labrador, PA-C Varney Gentleman, PA-C Cadence Orchid, PA-C Ronald Cockayne, NP Morey Ar, NP    We recommend signing up for the patient portal called "MyChart".  Sign up information is provided on this After Visit Summary.  MyChart is used to connect with patients for Virtual Visits (Telemedicine).  Patients are able to view lab/test results, encounter notes, upcoming appointments, etc.  Non-urgent messages can be sent to your provider as well.   To learn more about what you can do with MyChart, go to [email or web address].     Signed, William Delton, MD  01/11/2024 4:08 PM    [facility]

## 2024-01-12 ENCOUNTER — Encounter (INDEPENDENT_AMBULATORY_CARE_PROVIDER_SITE_OTHER): Payer: Self-pay

## 2024-01-21 ENCOUNTER — Other Ambulatory Visit

## 2024-02-02 ENCOUNTER — Ambulatory Visit: Admission: RE | Admit: 2024-02-02 | Source: Ambulatory Visit

## 2024-02-16 ENCOUNTER — Ambulatory Visit: Admission: RE | Admit: 2024-02-16 | Source: Ambulatory Visit

## 2024-02-17 ENCOUNTER — Ambulatory Visit: Attending: Cardiology

## 2024-03-03 ENCOUNTER — Ambulatory Visit: Attending: Cardiology

## 2024-03-22 ENCOUNTER — Other Ambulatory Visit (INDEPENDENT_AMBULATORY_CARE_PROVIDER_SITE_OTHER): Payer: Self-pay | Admitting: Vascular Surgery

## 2024-03-22 DIAGNOSIS — N186 End stage renal disease: Secondary | ICD-10-CM

## 2024-03-24 ENCOUNTER — Ambulatory Visit (INDEPENDENT_AMBULATORY_CARE_PROVIDER_SITE_OTHER): Payer: 59 | Admitting: Nurse Practitioner

## 2024-03-24 ENCOUNTER — Encounter (INDEPENDENT_AMBULATORY_CARE_PROVIDER_SITE_OTHER): Payer: 59

## 2024-03-31 ENCOUNTER — Ambulatory Visit (INDEPENDENT_AMBULATORY_CARE_PROVIDER_SITE_OTHER): Admitting: Nurse Practitioner

## 2024-03-31 ENCOUNTER — Encounter (INDEPENDENT_AMBULATORY_CARE_PROVIDER_SITE_OTHER): Payer: Self-pay | Admitting: Nurse Practitioner

## 2024-03-31 ENCOUNTER — Ambulatory Visit (INDEPENDENT_AMBULATORY_CARE_PROVIDER_SITE_OTHER)

## 2024-03-31 VITALS — BP 194/91 | HR 72 | Resp 18 | Ht 74.0 in | Wt 205.8 lb

## 2024-03-31 DIAGNOSIS — Z992 Dependence on renal dialysis: Secondary | ICD-10-CM

## 2024-03-31 DIAGNOSIS — N186 End stage renal disease: Secondary | ICD-10-CM

## 2024-03-31 DIAGNOSIS — Z720 Tobacco use: Secondary | ICD-10-CM

## 2024-03-31 NOTE — Progress Notes (Signed)
 Subjective:    Patient ID: William Hobbs., male    DOB: 03-Oct-1965, 58 y.o.   MRN: [id number] Chief Complaint  Patient presents with   Follow-up    6 month follow up/HDA - access diminished at venous site    The patient returns to the office for followup of their dialysis access.  He has a radiocephalic AV fistula which was created in 2005  The patient reports the function of the access has been stable. Patient denies difficulty with cannulation. The patient denies increased bleeding time after removing the needles. The patient denies hand pain or other symptoms consistent with steal phenomena.  No significant arm swelling.  The patient was sent in for an earlier visit because there was notably decreased bruit near the proximal portion of his fistula however throughout the rest of the fistula he does have a fairly good bruit  The patient denies redness or swelling at the access site. The patient denies fever or chills at home or while on dialysis.  No recent shortening of the patient's walking distance or new symptoms consistent with claudication.  No history of rest pain symptoms. No new ulcers or wounds of the lower extremities have occurred.  The patient denies amaurosis fugax or recent TIA symptoms. There are no recent neurological changes noted. There is no history of DVT, PE or superficial thrombophlebitis. No recent episodes of angina or shortness of breath documented.   Duplex ultrasound of the AV access shows a patent access.  The previously noted stenosis is not significantly changed compared to last study.  Flow volume today is 1464 cc/min (previous flow volume was 1839 cc/min).  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      Review of Systems  Hematological:  Does not bruise/bleed easily.  All other systems reviewed and are negative.      Objective:   Physical Exam Vitals reviewed.  HENT:     Head:  Normocephalic.  Cardiovascular:     Rate and Rhythm: Normal rate.     Pulses:          Radial pulses are 2+ on the left side.     Arteriovenous access: Left arteriovenous access is present.     Comments: Good thrill and bruit Pulmonary:     Effort: Pulmonary effort is normal.  Skin:    General: Skin is warm and dry.  Neurological:     Mental Status: He is alert and oriented to person, place, and time.  Psychiatric:        Mood and Affect: Mood normal.        Behavior: Behavior normal.        Thought Content: Thought content normal.        Judgment: Judgment normal.     BP (!) 194/91 (BP Location: Right Arm, Patient Position: Sitting, Cuff Size: Normal)   Pulse 72   Resp 18   Ht 6' 2 (1.88 m)   Wt 205 lb 12.8 oz (93.4 kg)   BMI 26.42 kg/m   Past Medical History:  Diagnosis Date   Chronic kidney disease    Dialysis patient (HCC)    Hypertension     Social History   Socioeconomic History   Marital status: Married    Spouse name: Not on file   Number of children: Not on file   Years of education: Not on file   Highest education level: Not on  file  Occupational History   Not on file  Tobacco Use   Smoking status: Every Day    Current packs/day: 0.25    Types: Cigarettes   Smokeless tobacco: Never   Tobacco comments:    1 pack every 3 days.  Vaping Use   Vaping status: Never Used  Substance and Sexual Activity   Alcohol use: No   Drug use: Yes    Types: Marijuana   Sexual activity: Not on file  Other Topics Concern   Not on file  Social History Narrative   Not on file   Social Drivers of Health   Financial Resource Strain: Low Risk  (03/29/2024)   Received from [facility]   Overall Financial Resource Strain (CARDIA)    Difficulty of Paying Living Expenses: Not hard at all  Food Insecurity: Food Insecurity Present (03/29/2024)   Received from [facility]   Hunger Vital Sign    Within the past 12 months, you worried  that your food would run out before you got the money to buy more.: Sometimes true    Within the past 12 months, the food you bought just didn't last and you didn't have money to get more.: Sometimes true  Transportation Needs: Unmet Transportation Needs (03/29/2024)   Received from [facility]   PRAPARE - Transportation    In the past 12 months, has lack of transportation kept you from medical appointments or from getting medications?: Yes    Lack of Transportation (Non-Medical): Yes  Physical Activity: Not on file  Stress: Not on file  Social Connections: Not on file  Intimate Partner Violence: Not on file    Past Surgical History:  Procedure Laterality Date   APPENDECTOMY     age 61   AV FISTULA PLACEMENT      Family History  Problem Relation Age of Onset   Heart failure Father    Heart disease Maternal Grandmother    Heart disease Paternal Grandmother     No Known Allergies     Latest Ref Rng & Units 02/19/2023    6:31 AM 02/04/2023   11:30 AM 11/14/2022   11:26 AM  CBC  WBC 4.0 - 10.5 K/uL 7.6  7.0  7.6   Hemoglobin 13.0 - 17.0 g/dL 9.9  9.6  8.3   Hematocrit 39.0 - 52.0 % 32.1  31.5  26.9   Platelets 150 - 400 K/uL 246  260  202       CMP     Component Value Date/Time   NA 137 02/19/2023 0631   NA 142 12/20/2014 1212   K 4.3 12/16/2023 1204   K 3.5 12/20/2014 1212   CL 93 (L) 02/19/2023 0631   CL 98 (L) 12/20/2014 1212   CO2 25 02/19/2023 0631   CO2 32 12/20/2014 1212   GLUCOSE 113 (H) 02/19/2023 0631   GLUCOSE 76 12/20/2014 1212   BUN 101 (H) 02/19/2023 0631   BUN 19 12/20/2014 1212   CREATININE 13.98 (H) 02/19/2023 0631   CREATININE 7.30 (H) 12/20/2014 1212   CALCIUM  8.7 (L) 02/19/2023 0631   CALCIUM  8.9 12/20/2014 1212   PROT 7.7 02/19/2023 0631   PROT 7.1 12/20/2014 1212   ALBUMIN 3.8 02/19/2023 0631   ALBUMIN 3.7 12/20/2014 1212   AST 18 02/19/2023 0631   AST 14 (L) 12/20/2014 1212   ALT 18 02/19/2023 0631   ALT 12 (L)  12/20/2014 1212   ALKPHOS 86 02/19/2023 0631  ALKPHOS 66 12/20/2014 1212   BILITOT 0.7 02/19/2023 0631   BILITOT 0.3 12/20/2014 1212   GFRNONAA 4 (L) 02/19/2023 0631   GFRNONAA 8 (L) 12/20/2014 1212     No results found.     Assessment & Plan:   1. ESRD on hemodialysis (HCC) (Primary) Recommend:  The patient is doing well and currently has adequate dialysis access. The patient's dialysis center is not reporting any access issues. Flow pattern is stable when compared to the prior ultrasound.  The patient should have a duplex ultrasound of the dialysis access in 6 months. The patient will follow-up with me in the office after each ultrasound    2. Tobacco abuse Smoking cessation was discussed, 3-10 minutes spent on this topic specifically   Current Outpatient Medications on File Prior to Visit  Medication Sig Dispense Refill   b complex-vitamin c-folic acid  (NEPHRO-VITE) 0.8 MG TABS tablet Take 1 tablet by mouth daily.     calcium  acetate (PHOSLO ) 667 MG capsule Take 1,334 mg by mouth 3 (three) times daily with meals.     fluticasone  (FLONASE ) 50 MCG/ACT nasal spray Place 2 sprays into both nostrils 2 (two) times daily. 1 g 0   hydrALAZINE  (APRESOLINE ) 100 MG tablet Take 100 mg by mouth 3 (three) times daily.     labetalol  (NORMODYNE ) 200 MG tablet Take 600 mg by mouth 2 (two) times daily.     lidocaine  (LIDODERM ) 5 % Place 1 patch onto the skin daily. Remove & Discard patch within 12 hours or as directed by MD 30 patch 0   losartan  (COZAAR ) 100 MG tablet Take 100 mg by mouth daily.     nitroGLYCERIN (NITROSTAT) 0.3 MG SL tablet Place 0.3 mg under the tongue. (Patient taking differently: Place 0.3 mg under the tongue every 5 (five) minutes as needed for chest pain.)     omeprazole (PRILOSEC) 20 MG capsule Take 20 mg by mouth as needed.     predniSONE  (DELTASONE ) 20 MG tablet 3 tabs po qd x 2 days, then 2 tabs po qd x 3 days, then 1 tab po qd x 3 days, then half a tab po qd x 2  days 16 tablet 0   ranitidine (ZANTAC) 150 MG tablet Take 150 mg by mouth as needed.     sildenafil (VIAGRA) 100 MG tablet Take 100 mg by mouth daily as needed for erectile dysfunction.     No current facility-administered medications on file prior to visit.    There are no Patient Instructions on file for this visit. No follow-ups on file.   Kason Benak E Macoy Rodwell, NP

## 2024-04-05 ENCOUNTER — Telehealth: Payer: Self-pay | Admitting: Cardiology

## 2024-04-05 NOTE — Telephone Encounter (Signed)
-----   Message from CMA Marina  L sent at 04/05/2024 11:42 AM EDT ----- Please see note below. ----- Message ----- From: Brien Salm, RN Sent: 04/05/2024  11:06 AM EDT To: Marina  JAYSON Jubilee, CMA  Please cancel appt and call patient to reschedule or see if the patient wants to continue with care.  Thanks! ----- Message ----- From: Jubilee, Marina  C, CMA Sent: 04/05/2024  10:36 AM EDT To: Salm Brien, RN  Pt has f/u 8/19# f/u echo and myoview. Pt has cancelled and never rescheduled testing. Please advise if pt should keep appointment or rescheduled testing.  Thank you, Marina , CMA

## 2024-04-05 NOTE — Telephone Encounter (Signed)
 Left voicemail for return call

## 2024-04-12 ENCOUNTER — Ambulatory Visit: Admitting: Cardiology

## 2024-04-15 ENCOUNTER — Other Ambulatory Visit: Payer: Self-pay

## 2024-04-15 DIAGNOSIS — Z7689 Persons encountering health services in other specified circumstances: Secondary | ICD-10-CM

## 2024-04-15 DIAGNOSIS — R911 Solitary pulmonary nodule: Secondary | ICD-10-CM

## 2024-04-15 DIAGNOSIS — F17210 Nicotine dependence, cigarettes, uncomplicated: Secondary | ICD-10-CM

## 2024-04-15 DIAGNOSIS — F17200 Nicotine dependence, unspecified, uncomplicated: Secondary | ICD-10-CM

## 2024-04-28 ENCOUNTER — Ambulatory Visit

## 2024-04-28 ENCOUNTER — Other Ambulatory Visit

## 2024-05-11 ENCOUNTER — Ambulatory Visit: Admission: RE | Admit: 2024-05-11 | Source: Ambulatory Visit

## 2024-05-19 ENCOUNTER — Ambulatory Visit: Attending: Cardiology

## 2024-05-26 ENCOUNTER — Ambulatory Visit: Attending: Cardiology | Admitting: Cardiology

## 2024-06-12 NOTE — Therapy (Unsigned)
 OUTPATIENT OCCUPATIONAL THERAPY ORTHO EVALUATION  Patient Name: William ZEIMET Sr. MRN: [id number] DOB:November 28, 1965, 58 y.o., male Today's Date: 06/13/2024  PCP: Dr Marcelino REFERRING PROVIDER: Dr Kathlynn  END OF SESSION:  OT End of Session - 06/13/24 1628     Visit Number 1    Number of Visits 6    Date for Recertification  07/25/24    OT Start Time 1628    OT Stop Time 1720    OT Time Calculation (min) 52 min    Activity Tolerance Patient tolerated treatment well    Behavior During Therapy WFL for tasks assessed/performed          Past Medical History:  Diagnosis Date   Chronic kidney disease    Dialysis patient    Hypertension    Past Surgical History:  Procedure Laterality Date   APPENDECTOMY     age 89   AV FISTULA PLACEMENT     Patient Active Problem List   Diagnosis Date Noted   ESRD (end stage renal disease) on dialysis (HCC) 01/11/2015   Chest pain 01/04/2015   HTN (hypertension) 01/04/2015   ESRD on hemodialysis (HCC) 01/04/2015   Tobacco abuse 06/08/2014    ONSET DATE: year ago  REFERRING DIAG: 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  And is crossing under my ring finger.  But my fingers and hand has been bothering me probably a year.  I cannot make a fist like holding small objects in my palm.  And then they hurt really bad. 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 It is likely related  to arthritis, though rheumatoid arthritis is unconfirmed, and is not related to a prior fifth metacarpal fracture. He experiences loss of motion and pain upon extension. Refer to hand therapy at [facility] for splinting and exercises. Schedule therapy on non-dialysis days. If no improvement in one month, refer to hand specialist Dr. Ezra.  Right hand joint pain  He has joint pain in the right hand, particularly in the thumb CMC joint, consistent with osteoarthritis. There is no significant arthritis in other hand joints and no carpal instability pattern despite scapholunate widening. Diagnoses and all orders for this visit:   PRECAUTIONS: None   WEIGHT BEARING RESTRICTIONS: No  PAIN:  Are you having pain?  8/10 and 4th and 5th digits on the right most  FALLS: Has patient fallen in last 6 months? 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  Do things around the house and with the family Has dialysis Monday Wednesday Fridays  PATIENT GOALS: I want the pain and motion in my hand better so I can hold medication and small objects into my buttons and zips and grip objects  NEXT MD VISIT: After 4 to 6 weeks of therapy-if needed  OBJECTIVE:  Note: Objective  measures were completed at Evaluation unless otherwise noted.  HAND DOMINANCE: Right  ADLs: Patient unable to make a full fist and trouble with  lifting and gripping objects, holding cylinder objects, doing buttons and zips, holding medicine or small objects, opening jars or cut food  FUNCTIONAL OUTCOME MEASURES: PRWHE   UPPER EXTREMITY ROM:     Active ROM Right eval Left eval  Shoulder flexion    Shoulder abduction    Shoulder adduction    Shoulder extension    Shoulder internal rotation    Shoulder external rotation    Elbow flexion    Elbow extension    Wrist flexion 55  pain dorsal wrist    Wrist extension 60 volar wrist pain    Wrist ulnar deviation 30   Wrist radial deviation 15   Wrist pronation    Wrist supination    (Blank rows = not tested)  Active ROM Right eval Left eval  Thumb MCP (0-60)    Thumb IP (0-80)    Thumb Radial abd/add (0-55)     Thumb Palmar abd/add (0-45) Perform more radial abduction and palmar.  Atrophy of the thenar eminence    Thumb Opposition to Small Finger Opposition to 2nd and 3rd , unable 4th and pain to 5th     Index MCP (0-90) 80    Index PIP (0-100) 90    Index DIP (0-70)      Long MCP (0-90) 80     Long PIP (0-100) 85     Long DIP (0-70)      Ring MCP (0-90) 80     Ring PIP (0-100)  80    Ring DIP (0-70)      Little MCP (0-90) 80     Little PIP (0-100)  95    Little DIP (0-70)      (Blank rows = not tested)     HAND FUNCTION: Grip strength: Right: 26 lbs; Left: 26 lbs, Lateral pinch: Right: 16 lbs, Left: 11 lbs, and 3 point pinch: Right: 10 lbs, Left: 8 lbs  COORDINATION: Decreased dexterity because of sensation decreased on the DIPs.  And decreased opposition  SENSATION: Patient reports decreased sensation on the tips  EDEMA: Patient on dialysis 3 times a week.  Can tell if he did not had dialysis or the day before he has increased swelling  COGNITION: Overall cognitive status: Within functional limits for tasks assessed     TREATMENT DATE: 06/13/24                                                                                                                            Recommend for patient to do contrast in the morning and especially in the evening To decrease stiffness increase motion in the morning as well as in the evenings and then done in the evening Isotoner glove for nighttime to decrease swelling  Fitted with oval 8 #10 splint for right fourth swan-neck deformity Per her  fit patient with decreased hyperextension and swan-neck with increased flexion at the PIP. Patient able to  perform increased flexion with less pain as well as opposition to fourth  After contrast patient to do tendon glides 12 reps each Focusing on motion and not strengthening and gripping 12 reps of palmar radial abduction of the thumb As well as opposition to all digits 5-8 reps      PATIENT EDUCATION: Education details: findings of eval and HEP  Person educated: Patient Education method: Explanation, Demonstration, Tactile cues, Verbal cues, and Handouts Education comprehension: verbalized understanding, returned demonstration, verbal cues required, and needs further education    GOALS: Goals reviewed with patient? Yes  SHORT TERM GOALS: Target date: 2 wks  Patient to be independent in wearing of oval 8 splint and performing home exercises to report decreased pain in digits as well as show increased flexion touching palm with less hyperextension of fourth PIP Baseline: Pain 8-10/10 in all digits.  Hyperextension of the fourth PIP with decreased flexion of all digits and with increased pain Goal status: INITIAL    LONG TERM GOALS: Target date: 6 wks  Patient right hand digit flexion improved to be able to touch palm pain-free to report increased use and ADLs Baseline: Hyperextension/ swanneck on R 4th PIP , decreased DIP flexion as well as decreased MCP and PIP flexion unable to touch palm-difficulty gripping objects and ADLs as well as cylinder objects and medication: Pain 8-10/10 in all fingers with flexion Goal status: INITIAL  2.  Right grip strength improved with more than 5 to 10 pounds for patient to be able to carry half a gallon without increased symptoms, use utensils and hold medicine and palm Baseline: Grip left and right 26 pounds; pain with flexion of digits 8-10/10. Goal status: INITIAL  3.  Right wrist flexion extension and radial deviation improved to within functional range pain-free to be able to turn a doorknob, push and pull door Baseline: Patient right wrist  flexion 55 extension 60 with pain as well as radial deviation 15-difficulty using right hand. 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  Patient limited in wrist active range of motion with increased pain as well as decreased flexion of all digits.  Decreased grip strength in right and left with 26 pounds.  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  Patient is on dialysis 3 times a week.  Patient has some decreased thumb palmar abduction with atrophy over the thenar eminence.  Decreased opposition and fine motor.  Patient limited in functional use of right dominant hand in ADLs and IADLs.  Patient can benefit from skilled OT services to decrease edema and pain and increase motion and strength as well as decrease swanneck deformity on right fourth digit to report and show increase independence and use of right hand in ADLs and IADLs.  PERFORMANCE DEFICITS: in functional skills including ADLs, IADLs, ROM, strength, pain, flexibility, decreased knowledge of use of DME, and UE functional use,   and psychosocial skills including environmental adaptation and routines and behaviors.   IMPAIRMENTS: are limiting patient from ADLs, IADLs, rest and sleep, play, leisure, and social participation.   COMORBIDITIES: has no other co-morbidities that affects occupational performance. Patient will benefit from skilled OT  to address above impairments and improve overall function.  MODIFICATION OR ASSISTANCE TO COMPLETE EVALUATION: No modification of tasks or assist necessary to complete an evaluation.  OT OCCUPATIONAL PROFILE AND HISTORY: Problem focused assessment: Including review of records relating to presenting problem.  CLINICAL DECISION  MAKING: LOW - limited treatment options, no task modification necessary  REHAB POTENTIAL: Good for goals  EVALUATION COMPLEXITY: Low     PLAN:  OT FREQUENCY: 1x/week  OT DURATION: 6 weeks  PLANNED INTERVENTIONS: 97168 OT Re-evaluation, 97535 self care/ADL training, 02889 therapeutic exercise, 97140 manual therapy, 97018 paraffin, 02960 fluidotherapy, 97760 Orthotic Initial, [id number] Orthotic/Prosthetic subsequent, passive range of motion, patient/family education, and DME and/or AE instructions   CONSULTED AND AGREED WITH PLAN OF CARE: Patient    Ancel Peters, OTR/L,CLT 06/13/2024, 5:28 PM

## 2024-06-13 ENCOUNTER — Encounter: Payer: Self-pay | Admitting: Occupational Therapy

## 2024-06-13 ENCOUNTER — Ambulatory Visit: Attending: Orthopedic Surgery | Admitting: Occupational Therapy

## 2024-06-13 DIAGNOSIS — M6281 Muscle weakness (generalized): Secondary | ICD-10-CM | POA: Diagnosis present

## 2024-06-13 DIAGNOSIS — M79641 Pain in right hand: Secondary | ICD-10-CM | POA: Diagnosis present

## 2024-06-13 DIAGNOSIS — M25641 Stiffness of right hand, not elsewhere classified: Secondary | ICD-10-CM | POA: Diagnosis present

## 2024-06-13 DIAGNOSIS — M20031 Swan-neck deformity of right finger(s): Secondary | ICD-10-CM | POA: Insufficient documentation

## 2024-06-16 ENCOUNTER — Ambulatory Visit: Admitting: Occupational Therapy

## 2024-06-20 ENCOUNTER — Ambulatory Visit: Admitting: Occupational Therapy

## 2024-06-30 ENCOUNTER — Ambulatory Visit: Admitting: Occupational Therapy

## 2024-07-07 ENCOUNTER — Ambulatory Visit: Attending: Orthopedic Surgery | Admitting: Occupational Therapy

## 2024-07-08 ENCOUNTER — Telehealth: Payer: Self-pay | Admitting: Occupational Therapy

## 2024-07-08 NOTE — Telephone Encounter (Signed)
 No show 07/07/24 2nd NS and one cx appt since [facility] - eval was 06/13/24  . Called patient and left a message pertaining to appointment and a reminder that I do not have any more follow ups for him -and to call if want or need to cont OT /hand therapy   Return phone call requested. Phone number [phone]) provided.   Aizza Santiago du [employer] OTR/L,CLT

## 2024-07-14 ENCOUNTER — Ambulatory Visit: Admitting: Occupational Therapy

## 2024-09-29 ENCOUNTER — Encounter (INDEPENDENT_AMBULATORY_CARE_PROVIDER_SITE_OTHER)

## 2024-09-29 ENCOUNTER — Ambulatory Visit (INDEPENDENT_AMBULATORY_CARE_PROVIDER_SITE_OTHER): Admitting: Nurse Practitioner

## 2024-10-20 ENCOUNTER — Ambulatory Visit (INDEPENDENT_AMBULATORY_CARE_PROVIDER_SITE_OTHER): Admitting: Nurse Practitioner

## 2024-10-20 ENCOUNTER — Encounter (INDEPENDENT_AMBULATORY_CARE_PROVIDER_SITE_OTHER)
